# Patient Record
Sex: Male | Born: 1945
Health system: Southern US, Community
[De-identification: ages and names within clinical notes are randomized; demographics above are authoritative.]

## PROBLEM LIST (undated history)

## (undated) DIAGNOSIS — E7800 Pure hypercholesterolemia, unspecified: Secondary | ICD-10-CM

## (undated) DIAGNOSIS — F329 Major depressive disorder, single episode, unspecified: Secondary | ICD-10-CM

## (undated) DIAGNOSIS — F32A Depression, unspecified: Secondary | ICD-10-CM

## (undated) DIAGNOSIS — F0390 Unspecified dementia without behavioral disturbance: Secondary | ICD-10-CM

## (undated) DIAGNOSIS — K219 Gastro-esophageal reflux disease without esophagitis: Secondary | ICD-10-CM

## (undated) DIAGNOSIS — I10 Essential (primary) hypertension: Secondary | ICD-10-CM

## (undated) DIAGNOSIS — M1990 Unspecified osteoarthritis, unspecified site: Secondary | ICD-10-CM

## (undated) DIAGNOSIS — E039 Hypothyroidism, unspecified: Secondary | ICD-10-CM

## (undated) HISTORY — PX: ESOPHAGOGASTRODUODENOSCOPY: SHX1529

## (undated) HISTORY — DX: Unspecified osteoarthritis, unspecified site: M19.90

## (undated) HISTORY — DX: Depression, unspecified: F32.A

## (undated) HISTORY — PX: EXCISIONAL HEMORRHOIDECTOMY: SHX1541

## (undated) HISTORY — DX: Gastro-esophageal reflux disease without esophagitis: K21.9

## (undated) HISTORY — DX: Hypothyroidism, unspecified: E03.9

## (undated) HISTORY — PX: TONSILLECTOMY: SUR1361

---
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
## 2002-05-19 ENCOUNTER — Ambulatory Visit (HOSPITAL_COMMUNITY): Admission: RE | Admit: 2002-05-19 | Discharge: 2002-05-19 | Payer: Self-pay | Admitting: Internal Medicine

## 2004-03-16 HISTORY — PX: COLONOSCOPY: SHX174

## 2004-11-28 ENCOUNTER — Ambulatory Visit: Payer: Self-pay | Admitting: Internal Medicine

## 2004-11-28 ENCOUNTER — Ambulatory Visit (HOSPITAL_COMMUNITY): Admission: RE | Admit: 2004-11-28 | Discharge: 2004-11-28 | Payer: Self-pay | Admitting: Internal Medicine

## 2006-02-24 ENCOUNTER — Ambulatory Visit (HOSPITAL_COMMUNITY): Admission: RE | Admit: 2006-02-24 | Discharge: 2006-02-24 | Payer: Self-pay | Admitting: Internal Medicine

## 2011-05-08 DIAGNOSIS — E039 Hypothyroidism, unspecified: Secondary | ICD-10-CM | POA: Diagnosis not present

## 2011-05-15 DIAGNOSIS — I10 Essential (primary) hypertension: Secondary | ICD-10-CM | POA: Diagnosis not present

## 2011-05-15 DIAGNOSIS — E039 Hypothyroidism, unspecified: Secondary | ICD-10-CM | POA: Diagnosis not present

## 2011-09-24 DIAGNOSIS — I10 Essential (primary) hypertension: Secondary | ICD-10-CM | POA: Diagnosis not present

## 2011-11-02 DIAGNOSIS — H25019 Cortical age-related cataract, unspecified eye: Secondary | ICD-10-CM | POA: Diagnosis not present

## 2011-11-02 DIAGNOSIS — H526 Other disorders of refraction: Secondary | ICD-10-CM | POA: Diagnosis not present

## 2012-01-29 DIAGNOSIS — E785 Hyperlipidemia, unspecified: Secondary | ICD-10-CM | POA: Diagnosis not present

## 2012-01-29 DIAGNOSIS — Z79899 Other long term (current) drug therapy: Secondary | ICD-10-CM | POA: Diagnosis not present

## 2012-01-29 DIAGNOSIS — Z125 Encounter for screening for malignant neoplasm of prostate: Secondary | ICD-10-CM | POA: Diagnosis not present

## 2012-01-29 DIAGNOSIS — E039 Hypothyroidism, unspecified: Secondary | ICD-10-CM | POA: Diagnosis not present

## 2012-01-29 DIAGNOSIS — R7301 Impaired fasting glucose: Secondary | ICD-10-CM | POA: Diagnosis not present

## 2012-01-29 DIAGNOSIS — I10 Essential (primary) hypertension: Secondary | ICD-10-CM | POA: Diagnosis not present

## 2012-02-05 DIAGNOSIS — E039 Hypothyroidism, unspecified: Secondary | ICD-10-CM | POA: Diagnosis not present

## 2012-02-05 DIAGNOSIS — Z23 Encounter for immunization: Secondary | ICD-10-CM | POA: Diagnosis not present

## 2012-02-05 DIAGNOSIS — R7301 Impaired fasting glucose: Secondary | ICD-10-CM | POA: Diagnosis not present

## 2012-02-05 DIAGNOSIS — E785 Hyperlipidemia, unspecified: Secondary | ICD-10-CM | POA: Diagnosis not present

## 2012-02-05 DIAGNOSIS — Z1212 Encounter for screening for malignant neoplasm of rectum: Secondary | ICD-10-CM | POA: Diagnosis not present

## 2012-02-05 DIAGNOSIS — I10 Essential (primary) hypertension: Secondary | ICD-10-CM | POA: Diagnosis not present

## 2012-05-05 DIAGNOSIS — E785 Hyperlipidemia, unspecified: Secondary | ICD-10-CM | POA: Diagnosis not present

## 2012-05-05 DIAGNOSIS — E039 Hypothyroidism, unspecified: Secondary | ICD-10-CM | POA: Diagnosis not present

## 2012-05-12 DIAGNOSIS — E785 Hyperlipidemia, unspecified: Secondary | ICD-10-CM | POA: Diagnosis not present

## 2012-05-12 DIAGNOSIS — E039 Hypothyroidism, unspecified: Secondary | ICD-10-CM | POA: Diagnosis not present

## 2012-06-08 DIAGNOSIS — H25099 Other age-related incipient cataract, unspecified eye: Secondary | ICD-10-CM | POA: Diagnosis not present

## 2012-08-01 DIAGNOSIS — M79609 Pain in unspecified limb: Secondary | ICD-10-CM | POA: Diagnosis not present

## 2012-08-01 DIAGNOSIS — R21 Rash and other nonspecific skin eruption: Secondary | ICD-10-CM | POA: Diagnosis not present

## 2012-08-04 DIAGNOSIS — E039 Hypothyroidism, unspecified: Secondary | ICD-10-CM | POA: Diagnosis not present

## 2012-08-10 DIAGNOSIS — E039 Hypothyroidism, unspecified: Secondary | ICD-10-CM | POA: Diagnosis not present

## 2012-08-19 DIAGNOSIS — E039 Hypothyroidism, unspecified: Secondary | ICD-10-CM | POA: Diagnosis not present

## 2012-08-19 DIAGNOSIS — E785 Hyperlipidemia, unspecified: Secondary | ICD-10-CM | POA: Diagnosis not present

## 2013-02-03 DIAGNOSIS — E039 Hypothyroidism, unspecified: Secondary | ICD-10-CM | POA: Diagnosis not present

## 2013-02-03 DIAGNOSIS — R7301 Impaired fasting glucose: Secondary | ICD-10-CM | POA: Diagnosis not present

## 2013-02-03 DIAGNOSIS — I10 Essential (primary) hypertension: Secondary | ICD-10-CM | POA: Diagnosis not present

## 2013-02-03 DIAGNOSIS — K219 Gastro-esophageal reflux disease without esophagitis: Secondary | ICD-10-CM | POA: Diagnosis not present

## 2013-02-03 DIAGNOSIS — Z79899 Other long term (current) drug therapy: Secondary | ICD-10-CM | POA: Diagnosis not present

## 2013-02-06 DIAGNOSIS — Z23 Encounter for immunization: Secondary | ICD-10-CM | POA: Diagnosis not present

## 2013-02-17 DIAGNOSIS — Z0000 Encounter for general adult medical examination without abnormal findings: Secondary | ICD-10-CM | POA: Diagnosis not present

## 2013-02-17 DIAGNOSIS — Z1212 Encounter for screening for malignant neoplasm of rectum: Secondary | ICD-10-CM | POA: Diagnosis not present

## 2013-06-02 DIAGNOSIS — E039 Hypothyroidism, unspecified: Secondary | ICD-10-CM | POA: Diagnosis not present

## 2013-06-09 DIAGNOSIS — E039 Hypothyroidism, unspecified: Secondary | ICD-10-CM | POA: Diagnosis not present

## 2013-06-09 DIAGNOSIS — I10 Essential (primary) hypertension: Secondary | ICD-10-CM | POA: Diagnosis not present

## 2013-10-19 DIAGNOSIS — I10 Essential (primary) hypertension: Secondary | ICD-10-CM | POA: Diagnosis not present

## 2013-12-07 DIAGNOSIS — I10 Essential (primary) hypertension: Secondary | ICD-10-CM | POA: Diagnosis not present

## 2014-01-18 DIAGNOSIS — R208 Other disturbances of skin sensation: Secondary | ICD-10-CM | POA: Diagnosis not present

## 2014-01-18 DIAGNOSIS — D2272 Melanocytic nevi of left lower limb, including hip: Secondary | ICD-10-CM | POA: Diagnosis not present

## 2014-01-18 DIAGNOSIS — L821 Other seborrheic keratosis: Secondary | ICD-10-CM | POA: Diagnosis not present

## 2014-01-18 DIAGNOSIS — D1801 Hemangioma of skin and subcutaneous tissue: Secondary | ICD-10-CM | POA: Diagnosis not present

## 2014-01-18 DIAGNOSIS — L570 Actinic keratosis: Secondary | ICD-10-CM | POA: Diagnosis not present

## 2014-02-15 DIAGNOSIS — M1990 Unspecified osteoarthritis, unspecified site: Secondary | ICD-10-CM | POA: Diagnosis not present

## 2014-02-15 DIAGNOSIS — K219 Gastro-esophageal reflux disease without esophagitis: Secondary | ICD-10-CM | POA: Diagnosis not present

## 2014-02-15 DIAGNOSIS — E785 Hyperlipidemia, unspecified: Secondary | ICD-10-CM | POA: Diagnosis not present

## 2014-02-15 DIAGNOSIS — E039 Hypothyroidism, unspecified: Secondary | ICD-10-CM | POA: Diagnosis not present

## 2014-02-15 DIAGNOSIS — Z125 Encounter for screening for malignant neoplasm of prostate: Secondary | ICD-10-CM | POA: Diagnosis not present

## 2014-02-15 DIAGNOSIS — Z79899 Other long term (current) drug therapy: Secondary | ICD-10-CM | POA: Diagnosis not present

## 2014-02-23 DIAGNOSIS — Z0001 Encounter for general adult medical examination with abnormal findings: Secondary | ICD-10-CM | POA: Diagnosis not present

## 2014-06-20 DIAGNOSIS — E039 Hypothyroidism, unspecified: Secondary | ICD-10-CM | POA: Diagnosis not present

## 2014-06-22 DIAGNOSIS — E030 Congenital hypothyroidism with diffuse goiter: Secondary | ICD-10-CM | POA: Diagnosis not present

## 2014-06-22 DIAGNOSIS — I10 Essential (primary) hypertension: Secondary | ICD-10-CM | POA: Diagnosis not present

## 2014-06-22 DIAGNOSIS — Z23 Encounter for immunization: Secondary | ICD-10-CM | POA: Diagnosis not present

## 2014-08-10 DIAGNOSIS — R413 Other amnesia: Secondary | ICD-10-CM | POA: Diagnosis not present

## 2014-08-10 DIAGNOSIS — M25511 Pain in right shoulder: Secondary | ICD-10-CM | POA: Diagnosis not present

## 2014-08-10 DIAGNOSIS — M7712 Lateral epicondylitis, left elbow: Secondary | ICD-10-CM | POA: Diagnosis not present

## 2014-08-14 ENCOUNTER — Other Ambulatory Visit (HOSPITAL_COMMUNITY): Payer: Self-pay | Admitting: Internal Medicine

## 2014-08-14 DIAGNOSIS — R413 Other amnesia: Secondary | ICD-10-CM

## 2014-08-24 ENCOUNTER — Ambulatory Visit (HOSPITAL_COMMUNITY)
Admission: RE | Admit: 2014-08-24 | Discharge: 2014-08-24 | Disposition: A | Discharge status: Home / Self Care | Payer: Medicare Other | Source: Ambulatory Visit | Attending: Internal Medicine | Admitting: Internal Medicine

## 2014-08-24 DIAGNOSIS — Z818 Family history of other mental and behavioral disorders: Secondary | ICD-10-CM | POA: Diagnosis not present

## 2014-08-24 DIAGNOSIS — R413 Other amnesia: Secondary | ICD-10-CM | POA: Insufficient documentation

## 2014-08-29 DIAGNOSIS — M7712 Lateral epicondylitis, left elbow: Secondary | ICD-10-CM | POA: Diagnosis not present

## 2014-08-29 DIAGNOSIS — M7541 Impingement syndrome of right shoulder: Secondary | ICD-10-CM | POA: Diagnosis not present

## 2014-09-13 DIAGNOSIS — H524 Presbyopia: Secondary | ICD-10-CM | POA: Diagnosis not present

## 2014-09-13 DIAGNOSIS — H52223 Regular astigmatism, bilateral: Secondary | ICD-10-CM | POA: Diagnosis not present

## 2014-09-13 DIAGNOSIS — H18413 Arcus senilis, bilateral: Secondary | ICD-10-CM | POA: Diagnosis not present

## 2014-09-13 DIAGNOSIS — H5203 Hypermetropia, bilateral: Secondary | ICD-10-CM | POA: Diagnosis not present

## 2014-09-13 DIAGNOSIS — H11153 Pinguecula, bilateral: Secondary | ICD-10-CM | POA: Diagnosis not present

## 2014-09-13 DIAGNOSIS — H40053 Ocular hypertension, bilateral: Secondary | ICD-10-CM | POA: Diagnosis not present

## 2014-09-13 DIAGNOSIS — H40013 Open angle with borderline findings, low risk, bilateral: Secondary | ICD-10-CM | POA: Diagnosis not present

## 2014-09-13 DIAGNOSIS — H25013 Cortical age-related cataract, bilateral: Secondary | ICD-10-CM | POA: Diagnosis not present

## 2014-10-16 ENCOUNTER — Telehealth: Payer: Self-pay

## 2014-10-16 NOTE — Telephone Encounter (Signed)
Letter mailed to pt.  

## 2014-10-16 NOTE — Telephone Encounter (Signed)
PATIENT ON September RECALL FOR TCS

## 2014-11-08 DIAGNOSIS — E039 Hypothyroidism, unspecified: Secondary | ICD-10-CM | POA: Diagnosis not present

## 2014-11-16 DIAGNOSIS — E030 Congenital hypothyroidism with diffuse goiter: Secondary | ICD-10-CM | POA: Diagnosis not present

## 2014-11-16 DIAGNOSIS — Z6830 Body mass index (BMI) 30.0-30.9, adult: Secondary | ICD-10-CM | POA: Diagnosis not present

## 2014-11-16 DIAGNOSIS — R413 Other amnesia: Secondary | ICD-10-CM | POA: Diagnosis not present

## 2014-11-19 ENCOUNTER — Encounter (HOSPITAL_COMMUNITY): Payer: Self-pay | Admitting: *Deleted

## 2014-11-19 ENCOUNTER — Emergency Department (HOSPITAL_COMMUNITY)
Admission: EM | Admit: 2014-11-19 | Discharge: 2014-11-19 | Disposition: A | Discharge status: Home / Self Care | Payer: Medicare Other | Attending: Emergency Medicine | Admitting: Emergency Medicine

## 2014-11-19 DIAGNOSIS — Z79899 Other long term (current) drug therapy: Secondary | ICD-10-CM | POA: Insufficient documentation

## 2014-11-19 DIAGNOSIS — Z87891 Personal history of nicotine dependence: Secondary | ICD-10-CM | POA: Diagnosis not present

## 2014-11-19 DIAGNOSIS — R42 Dizziness and giddiness: Secondary | ICD-10-CM | POA: Diagnosis not present

## 2014-11-19 DIAGNOSIS — E780 Pure hypercholesterolemia: Secondary | ICD-10-CM | POA: Diagnosis not present

## 2014-11-19 DIAGNOSIS — R197 Diarrhea, unspecified: Secondary | ICD-10-CM | POA: Diagnosis not present

## 2014-11-19 DIAGNOSIS — R1110 Vomiting, unspecified: Secondary | ICD-10-CM

## 2014-11-19 DIAGNOSIS — K2970 Gastritis, unspecified, without bleeding: Secondary | ICD-10-CM | POA: Diagnosis not present

## 2014-11-19 DIAGNOSIS — R112 Nausea with vomiting, unspecified: Secondary | ICD-10-CM | POA: Diagnosis not present

## 2014-11-19 DIAGNOSIS — I10 Essential (primary) hypertension: Secondary | ICD-10-CM | POA: Diagnosis not present

## 2014-11-19 DIAGNOSIS — F0390 Unspecified dementia without behavioral disturbance: Secondary | ICD-10-CM | POA: Diagnosis not present

## 2014-11-19 HISTORY — DX: Essential (primary) hypertension: I10

## 2014-11-19 HISTORY — DX: Unspecified dementia, unspecified severity, without behavioral disturbance, psychotic disturbance, mood disturbance, and anxiety: F03.90

## 2014-11-19 HISTORY — DX: Pure hypercholesterolemia, unspecified: E78.00

## 2014-11-19 LAB — CBC WITH DIFFERENTIAL/PLATELET
BASOS ABS: 0 10*3/uL (ref 0.0–0.1)
BASOS PCT: 0 % (ref 0–1)
EOS ABS: 0 10*3/uL (ref 0.0–0.7)
EOS PCT: 0 % (ref 0–5)
HCT: 41.3 % (ref 39.0–52.0)
HEMOGLOBIN: 14.2 g/dL (ref 13.0–17.0)
Lymphocytes Relative: 9 % — ABNORMAL LOW (ref 12–46)
Lymphs Abs: 1.1 10*3/uL (ref 0.7–4.0)
MCH: 30.8 pg (ref 26.0–34.0)
MCHC: 34.4 g/dL (ref 30.0–36.0)
MCV: 89.6 fL (ref 78.0–100.0)
Monocytes Absolute: 0.5 10*3/uL (ref 0.1–1.0)
Monocytes Relative: 5 % (ref 3–12)
NEUTROS PCT: 86 % — AB (ref 43–77)
Neutro Abs: 10.1 10*3/uL — ABNORMAL HIGH (ref 1.7–7.7)
PLATELETS: 214 10*3/uL (ref 150–400)
RBC: 4.61 MIL/uL (ref 4.22–5.81)
RDW: 13.1 % (ref 11.5–15.5)
WBC: 11.8 10*3/uL — AB (ref 4.0–10.5)

## 2014-11-19 LAB — COMPREHENSIVE METABOLIC PANEL
ALT: 16 U/L — AB (ref 17–63)
AST: 21 U/L (ref 15–41)
Albumin: 4 g/dL (ref 3.5–5.0)
Alkaline Phosphatase: 81 U/L (ref 38–126)
Anion gap: 8 (ref 5–15)
BILIRUBIN TOTAL: 1 mg/dL (ref 0.3–1.2)
BUN: 14 mg/dL (ref 6–20)
CALCIUM: 8.8 mg/dL — AB (ref 8.9–10.3)
CO2: 27 mmol/L (ref 22–32)
CREATININE: 0.81 mg/dL (ref 0.61–1.24)
Chloride: 107 mmol/L (ref 101–111)
Glucose, Bld: 168 mg/dL — ABNORMAL HIGH (ref 65–99)
Potassium: 3.2 mmol/L — ABNORMAL LOW (ref 3.5–5.1)
Sodium: 142 mmol/L (ref 135–145)
TOTAL PROTEIN: 6.4 g/dL — AB (ref 6.5–8.1)

## 2014-11-19 LAB — URINALYSIS, ROUTINE W REFLEX MICROSCOPIC
BILIRUBIN URINE: NEGATIVE
Glucose, UA: NEGATIVE mg/dL
HGB URINE DIPSTICK: NEGATIVE
KETONES UR: 15 mg/dL — AB
Leukocytes, UA: NEGATIVE
NITRITE: NEGATIVE
Protein, ur: NEGATIVE mg/dL
SPECIFIC GRAVITY, URINE: 1.02 (ref 1.005–1.030)
UROBILINOGEN UA: 0.2 mg/dL (ref 0.0–1.0)
pH: 7.5 (ref 5.0–8.0)

## 2014-11-19 LAB — POC OCCULT BLOOD, ED: FECAL OCCULT BLD: NEGATIVE

## 2014-11-19 LAB — TROPONIN I

## 2014-11-19 MED ORDER — ONDANSETRON HCL 4 MG/2ML IJ SOLN
4.0000 mg | Freq: Once | INTRAMUSCULAR | Status: AC
  Administered 2014-11-19: 4 mg via INTRAVENOUS
  Filled 2014-11-19: qty 2

## 2014-11-19 MED ORDER — POTASSIUM CHLORIDE CRYS ER 20 MEQ PO TBCR
40.0000 meq | EXTENDED_RELEASE_TABLET | Freq: Once | ORAL | Status: AC
  Administered 2014-11-19: 40 meq via ORAL
  Filled 2014-11-19: qty 2

## 2014-11-19 MED ORDER — SODIUM CHLORIDE 0.9 % IV BOLUS (SEPSIS)
1000.0000 mL | Freq: Once | INTRAVENOUS | Status: AC
  Administered 2014-11-19: 1000 mL via INTRAVENOUS

## 2014-11-19 MED ORDER — ONDANSETRON HCL 4 MG PO TABS: 4.0000 mg | ORAL_TABLET | Freq: Three times a day (TID) | ORAL | Status: DC | PRN

## 2014-11-19 NOTE — Discharge Instructions (Signed)
Drink plenty of fluids this morning, if doing well at lunchtime, you can have a bland diet such as toast, Jell-O, biscuits, mashed potatoes, Campbell soup. Slowly get your diet back to normal over the next 24 hours. Use the Zofran if you get nausea or vomiting again. Avoid milk until the diarrhea is gone. Return to the ED if you get abdominal pain, get lightheaded and feel like you are going to pass out, or you have uncontrollable diarrhea.   Diarrhea Diarrhea is watery poop (stool). It can make you feel weak, tired, thirsty, or give you a dry mouth (signs of dehydration). Watery poop is a sign of another problem, most often an infection. It often lasts 2-3 days. It can last longer if it is a sign of something serious. Take care of yourself as told by your doctor. HOME CARE   Drink 1 cup (8 ounces) of fluid each time you have watery poop.  Do not drink the following fluids:  Those that contain simple sugars (fructose, glucose, galactose, lactose, sucrose, maltose).  Sports drinks.  Fruit juices.  Whole milk products.  Sodas.  Drinks with caffeine (coffee, tea, soda) or alcohol.  Oral rehydration solution may be used if the doctor says it is okay. You may make your own solution. Follow this recipe:   - teaspoon table salt.   teaspoon baking soda.   teaspoon salt substitute containing potassium chloride.  1 tablespoons sugar.  1 liter (34 ounces) of water.  Avoid the following foods:  High fiber foods, such as raw fruits and vegetables.  Nuts, seeds, and whole grain breads and cereals.   Those that are sweetened with sugar alcohols (xylitol, sorbitol, mannitol).  Try eating the following foods:  Starchy foods, such as rice, toast, pasta, low-sugar cereal, oatmeal, baked potatoes, crackers, and bagels.  Bananas.  Applesauce.  Eat probiotic-rich foods, such as yogurt and milk products that are fermented.  Wash your hands well after each time you have watery  poop.  Only take medicine as told by your doctor.  Take a warm bath to help lessen burning or pain from having watery poop. GET HELP RIGHT AWAY IF:   You cannot drink fluids without throwing up (vomiting).  You keep throwing up.  You have blood in your poop, or your poop looks black and tarry.  You do not pee (urinate) in 6-8 hours, or there is only a small amount of very dark pee.  You have belly (abdominal) pain that gets worse or stays in the same spot (localizes).  You are weak, dizzy, confused, or light-headed.  You have a very bad headache.  Your watery poop gets worse or does not get better.  You have a fever or lasting symptoms for more than 2-3 days.  You have a fever and your symptoms suddenly get worse. MAKE SURE YOU:   Understand these instructions.  Will watch your condition.  Will get help right away if you are not doing well or get worse. Document Released: 08/19/2007 Document Revised: 07/17/2013 Document Reviewed: 11/08/2011 [facility] Patient Information 2015 [place], [place]. This information is not intended to replace advice given to you by your health care provider. Make sure you discuss any questions you have with your health care provider.  Nausea and Vomiting Nausea means you feel sick to your stomach. Throwing up (vomiting) is a reflex where stomach contents come out of your mouth. HOME CARE   Take medicine as told by your doctor.  Do not force yourself to  eat. However, you do need to drink fluids.  If you feel like eating, eat a normal diet as told by your doctor.  Eat rice, wheat, potatoes, bread, lean meats, yogurt, fruits, and vegetables.  Avoid high-fat foods.  Drink enough fluids to keep your pee (urine) clear or pale yellow.  Ask your doctor how to replace body fluid losses (rehydrate). Signs of body fluid loss (dehydration) include:  Feeling very thirsty.  Dry lips and mouth.  Feeling dizzy.  Dark pee.  Peeing less than  normal.  Feeling confused.  Fast breathing or heart rate. GET HELP RIGHT AWAY IF:   You have blood in your throw up.  You have black or bloody poop (stool).  You have a bad headache or stiff neck.  You feel confused.  You have bad belly (abdominal) pain.  You have chest pain or trouble breathing.  You do not pee at least once every 8 hours.  You have cold, clammy skin.  You keep throwing up after 24 to 48 hours.  You have a fever. MAKE SURE YOU:   Understand these instructions.  Will watch your condition.  Will get help right away if you are not doing well or get worse. Document Released: 08/19/2007 Document Revised: 05/25/2011 Document Reviewed: 08/01/2010 [facility] Patient Information 2015 [place], [place]. This information is not intended to replace advice given to you by your health care provider. Make sure you discuss any questions you have with your health care provider.

## 2014-11-19 NOTE — ED Notes (Signed)
Pt alert & oriented x4, stable gait. Patient  given discharge instructions, paperwork & prescription(s). Patient  instructed to stop at the registration desk to finish any additional paperwork. Patient  verbalized understanding. Pt left department in wheelchair escorted by staff. Pt had no further questions.

## 2014-11-19 NOTE — ED Notes (Signed)
Pt very vague with answers, ems reports they were called out for rectal bleed; pt states he had an episode of diarrhea tonight and profuse sweating; pt denies any pain; pt states his diarrhea was black

## 2014-11-19 NOTE — ED Provider Notes (Signed)
CSN: [id number]     Arrival date & time 11/19/14  0228 History   First MD Initiated Contact with Patient 11/19/14 [phone]     Chief Complaint  Patient presents with  . Diarrhea     (Consider location/radiation/quality/duration/timing/severity/associated sxs/prior Treatment) HPI patient reports he felt fine all day today. About 9:30 PM he started getting very nauseated. When he went to the bathroom he got very dizzy however he did not pass out. He reports one episode of black diarrhea. He had a small episode of vomiting. He relates however he was drenched in sweat. He denies any chest pain or abdominal pain. He denies taking Pepto-Bismol. He states he still has nausea. He denies any actual syncope. He states he did not eat anything different and he had no change in his activity. Wife states she can hear his abdomen making audible noise.  PCP Dr Willey Blade  Past Medical History  Diagnosis Date  . Dementia   . Hypertension   . Hypercholesteremia    History reviewed. No pertinent past surgical history. History reviewed. No pertinent family history. Social History  Substance Use Topics  . Smoking status: Former Research scientist (life sciences)  . Smokeless tobacco: None  . Alcohol Use: No  lives at home Lives with spouse  Review of Systems  All other systems reviewed and are negative.     Allergies  Review of patient's allergies indicates no known allergies.  Home Medications   Prior to Admission medications   Medication Sig Start Date End Date Taking? Authorizing Provider  amLODipine (NORVASC) 10 MG tablet Take 10 mg by mouth daily.   Yes Historical Provider, MD  atorvastatin (LIPITOR) 80 MG tablet Take 80 mg by mouth at bedtime.   Yes Historical Provider, MD  donepezil (ARICEPT) 10 MG tablet Take 10 mg by mouth at bedtime.   Yes Historical Provider, MD  levothyroxine (SYNTHROID, LEVOTHROID) 150 MCG tablet Take 150 mcg by mouth daily before breakfast.   Yes Historical Provider, MD  lisinopril  (PRINIVIL,ZESTRIL) 40 MG tablet Take 40 mg by mouth daily.   Yes Historical Provider, MD  meclizine (ANTIVERT) 25 MG tablet Take 25 mg by mouth 3 (three) times daily.   Yes Historical Provider, MD  ondansetron (ZOFRAN) 4 MG tablet Take 1 tablet (4 mg total) by mouth every 8 (eight) hours as needed. 11/19/14   Rolland Porter, MD   BP 146/57 mmHg  Pulse 58  Temp(Src) 97.4 F (36.3 C) (Oral)  Resp 18  Ht 6\' 3"  (1.905 m)  Wt 229 lb (103.874 kg)  BMI 28.62 kg/m2  SpO2 100%  Vital signs normal except for bradycardia  Physical Exam  Constitutional: He is oriented to person, place, and time. He appears well-developed and well-nourished.  Non-toxic appearance. He does not appear ill. No distress.  HENT:  Head: Normocephalic and atraumatic.  Right Ear: External ear normal.  Left Ear: External ear normal.  Nose: Nose normal. No mucosal edema or rhinorrhea.  Mouth/Throat: Oropharynx is clear and moist and mucous membranes are normal. No dental abscesses or uvula swelling.  Eyes: Conjunctivae and EOM are normal. Pupils are equal, round, and reactive to light.  Neck: Normal range of motion and full passive range of motion without pain. Neck supple.  Cardiovascular: Normal rate, regular rhythm and normal heart sounds.  Exam reveals no gallop and no friction rub.   No murmur heard. Pulmonary/Chest: Effort normal and breath sounds normal. No respiratory distress. He has no wheezes. He has no rhonchi. He has no rales.  He exhibits no tenderness and no crepitus.  Abdominal: Soft. Normal appearance and bowel sounds are normal. He exhibits no distension. There is no tenderness. There is no rebound and no guarding.  Genitourinary: Guaiac negative stool.  Musculoskeletal: Normal range of motion. He exhibits no edema or tenderness.  Moves all extremities well.   Neurological: He is alert and oriented to person, place, and time. He has normal strength. No cranial nerve deficit.  Skin: Skin is warm, dry and intact.  No rash noted. No erythema. No pallor.  Psychiatric: He has a normal mood and affect. His speech is normal and behavior is normal. His mood appears not anxious.  Nursing note and vitals reviewed.   ED Course  Procedures (including critical care time)  Medications  sodium chloride 0.9 % bolus 1,000 mL (0 mLs Intravenous Stopped 11/19/14 0453)  ondansetron (ZOFRAN) injection 4 mg (4 mg Intravenous Given 11/19/14 0307)  potassium chloride SA (K-DUR,KLOR-CON) CR tablet 40 mEq (40 mEq Oral Given 11/19/14 0450)    Patient was given IV fluids and IV Zofran for his nausea.  After reviewing patient's labs he was given oral potassium.  Patient and his wife are given his laboratory results. He states he is feeling better and he feels like he can try drinking oral fluids to see if he is going to be able to tolerate it go home.  Patient was given oral fluids which he tolerated well. Patient was discharged.   Labs Review Results for orders placed or performed during the hospital encounter of 11/19/14  Comprehensive metabolic panel  Result Value Ref Range   Sodium 142 135 - 145 mmol/L   Potassium 3.2 (L) 3.5 - 5.1 mmol/L   Chloride 107 101 - 111 mmol/L   CO2 27 22 - 32 mmol/L   Glucose, Bld 168 (H) 65 - 99 mg/dL   BUN 14 6 - 20 mg/dL   Creatinine, Ser 0.81 0.61 - 1.24 mg/dL   Calcium 8.8 (L) 8.9 - 10.3 mg/dL   Total Protein 6.4 (L) 6.5 - 8.1 g/dL   Albumin 4.0 3.5 - 5.0 g/dL   AST 21 15 - 41 U/L   ALT 16 (L) 17 - 63 U/L   Alkaline Phosphatase 81 38 - 126 U/L   Total Bilirubin 1.0 0.3 - 1.2 mg/dL   GFR calc non Af Amer >60 >60 mL/min   GFR calc Af Amer >60 >60 mL/min   Anion gap 8 5 - 15  CBC with Differential  Result Value Ref Range   WBC 11.8 (H) 4.0 - 10.5 K/uL   RBC 4.61 4.22 - 5.81 MIL/uL   Hemoglobin 14.2 13.0 - 17.0 g/dL   HCT 41.3 39.0 - 52.0 %   MCV 89.6 78.0 - 100.0 fL   MCH 30.8 26.0 - 34.0 pg   MCHC 34.4 30.0 - 36.0 g/dL   RDW 13.1 11.5 - 15.5 %   Platelets 214 150 - 400  K/uL   Neutrophils Relative % 86 (H) 43 - 77 %   Neutro Abs 10.1 (H) 1.7 - 7.7 K/uL   Lymphocytes Relative 9 (L) 12 - 46 %   Lymphs Abs 1.1 0.7 - 4.0 K/uL   Monocytes Relative 5 3 - 12 %   Monocytes Absolute 0.5 0.1 - 1.0 K/uL   Eosinophils Relative 0 0 - 5 %   Eosinophils Absolute 0.0 0.0 - 0.7 K/uL   Basophils Relative 0 0 - 1 %   Basophils Absolute 0.0 0.0 - 0.1 K/uL  Troponin I  Result Value Ref Range   Troponin I <0.03 <0.031 ng/mL  Urinalysis, Routine w reflex microscopic  Result Value Ref Range   Color, Urine YELLOW YELLOW   APPearance CLEAR CLEAR   Specific Gravity, Urine 1.020 1.005 - 1.030   pH 7.5 5.0 - 8.0   Glucose, UA NEGATIVE NEGATIVE mg/dL   Hgb urine dipstick NEGATIVE NEGATIVE   Bilirubin Urine NEGATIVE NEGATIVE   Ketones, ur 15 (A) NEGATIVE mg/dL   Protein, ur NEGATIVE NEGATIVE mg/dL   Urobilinogen, UA 0.2 0.0 - 1.0 mg/dL   Nitrite NEGATIVE NEGATIVE   Leukocytes, UA NEGATIVE NEGATIVE  POC occult blood, ED RN will collect  Result Value Ref Range   Fecal Occult Bld NEGATIVE NEGATIVE   Laboratory interpretation all normal except hypokalemia and leukocytosis     Imaging Review No results found. I have personally reviewed and evaluated these images and lab results as part of my medical decision-making.   EKG Interpretation   Date/Time:  Monday November 19 2014 03:01:59 EDT Ventricular Rate:  63 PR Interval:  233 QRS Duration: 101 QT Interval:  459 QTC Calculation: 470 R Axis:   88 Text Interpretation:  Sinus rhythm Prolonged PR interval Borderline right  axis deviation Borderline T wave abnormalities No old tracing to compare  Confirmed by Layonna Dobie  MD-I, Geovonni Meyerhoff ([id number]) on 11/19/2014 3:17:56 AM      MDM   Final diagnoses:  Vomiting and diarrhea   New Prescriptions   ONDANSETRON (ZOFRAN) 4 MG TABLET    Take 1 tablet (4 mg total) by mouth every 8 (eight) hours as needed.    Plan discharge  Rolland Porter, MD, Barbette Or, MD 11/19/14 [phone]

## 2014-11-19 NOTE — ED Notes (Signed)
Drink provided to pt as per EDP.

## 2014-11-19 NOTE — ED Notes (Signed)
Pt states no nausea after drinking coke.

## 2014-11-21 DIAGNOSIS — A09 Infectious gastroenteritis and colitis, unspecified: Secondary | ICD-10-CM | POA: Diagnosis not present

## 2014-11-21 DIAGNOSIS — Z6829 Body mass index (BMI) 29.0-29.9, adult: Secondary | ICD-10-CM | POA: Diagnosis not present

## 2015-01-24 DIAGNOSIS — Z23 Encounter for immunization: Secondary | ICD-10-CM | POA: Diagnosis not present

## 2015-02-22 DIAGNOSIS — E785 Hyperlipidemia, unspecified: Secondary | ICD-10-CM | POA: Diagnosis not present

## 2015-02-22 DIAGNOSIS — E039 Hypothyroidism, unspecified: Secondary | ICD-10-CM | POA: Diagnosis not present

## 2015-02-22 DIAGNOSIS — Z79899 Other long term (current) drug therapy: Secondary | ICD-10-CM | POA: Diagnosis not present

## 2015-02-22 DIAGNOSIS — K219 Gastro-esophageal reflux disease without esophagitis: Secondary | ICD-10-CM | POA: Diagnosis not present

## 2015-02-22 DIAGNOSIS — Z125 Encounter for screening for malignant neoplasm of prostate: Secondary | ICD-10-CM | POA: Diagnosis not present

## 2015-02-22 DIAGNOSIS — R7301 Impaired fasting glucose: Secondary | ICD-10-CM | POA: Diagnosis not present

## 2015-03-01 DIAGNOSIS — E030 Congenital hypothyroidism with diffuse goiter: Secondary | ICD-10-CM | POA: Diagnosis not present

## 2015-03-01 DIAGNOSIS — E785 Hyperlipidemia, unspecified: Secondary | ICD-10-CM | POA: Diagnosis not present

## 2015-03-01 DIAGNOSIS — Z6830 Body mass index (BMI) 30.0-30.9, adult: Secondary | ICD-10-CM | POA: Diagnosis not present

## 2015-03-01 DIAGNOSIS — I10 Essential (primary) hypertension: Secondary | ICD-10-CM | POA: Diagnosis not present

## 2015-04-26 DIAGNOSIS — Z6830 Body mass index (BMI) 30.0-30.9, adult: Secondary | ICD-10-CM | POA: Diagnosis not present

## 2015-04-26 DIAGNOSIS — I10 Essential (primary) hypertension: Secondary | ICD-10-CM | POA: Diagnosis not present

## 2015-04-26 DIAGNOSIS — R413 Other amnesia: Secondary | ICD-10-CM | POA: Diagnosis not present

## 2015-06-07 DIAGNOSIS — R413 Other amnesia: Secondary | ICD-10-CM | POA: Diagnosis not present

## 2015-06-07 DIAGNOSIS — I10 Essential (primary) hypertension: Secondary | ICD-10-CM | POA: Diagnosis not present

## 2015-07-04 DIAGNOSIS — M25562 Pain in left knee: Secondary | ICD-10-CM | POA: Diagnosis not present

## 2015-09-12 DIAGNOSIS — I10 Essential (primary) hypertension: Secondary | ICD-10-CM | POA: Diagnosis not present

## 2015-09-12 DIAGNOSIS — R413 Other amnesia: Secondary | ICD-10-CM | POA: Diagnosis not present

## 2015-12-13 DIAGNOSIS — I10 Essential (primary) hypertension: Secondary | ICD-10-CM | POA: Diagnosis not present

## 2015-12-13 DIAGNOSIS — Z23 Encounter for immunization: Secondary | ICD-10-CM | POA: Diagnosis not present

## 2015-12-13 DIAGNOSIS — R413 Other amnesia: Secondary | ICD-10-CM | POA: Diagnosis not present

## 2016-03-17 DIAGNOSIS — R7301 Impaired fasting glucose: Secondary | ICD-10-CM | POA: Diagnosis not present

## 2016-03-17 DIAGNOSIS — Z79899 Other long term (current) drug therapy: Secondary | ICD-10-CM | POA: Diagnosis not present

## 2016-03-17 DIAGNOSIS — K219 Gastro-esophageal reflux disease without esophagitis: Secondary | ICD-10-CM | POA: Diagnosis not present

## 2016-03-17 DIAGNOSIS — I10 Essential (primary) hypertension: Secondary | ICD-10-CM | POA: Diagnosis not present

## 2016-03-17 DIAGNOSIS — E039 Hypothyroidism, unspecified: Secondary | ICD-10-CM | POA: Diagnosis not present

## 2016-03-17 DIAGNOSIS — E785 Hyperlipidemia, unspecified: Secondary | ICD-10-CM | POA: Diagnosis not present

## 2016-03-24 ENCOUNTER — Telehealth: Payer: Self-pay

## 2016-03-24 DIAGNOSIS — E785 Hyperlipidemia, unspecified: Secondary | ICD-10-CM | POA: Diagnosis not present

## 2016-03-24 DIAGNOSIS — E039 Hypothyroidism, unspecified: Secondary | ICD-10-CM | POA: Diagnosis not present

## 2016-03-24 DIAGNOSIS — Z6832 Body mass index (BMI) 32.0-32.9, adult: Secondary | ICD-10-CM | POA: Diagnosis not present

## 2016-03-24 DIAGNOSIS — Z0001 Encounter for general adult medical examination with abnormal findings: Secondary | ICD-10-CM | POA: Diagnosis not present

## 2016-03-24 NOTE — Telephone Encounter (Signed)
PATIENT DUE FOR 10 YR TCS  PLEASE CALL [phone]

## 2016-03-24 NOTE — Telephone Encounter (Signed)
Called to triage pt. Spoke to pt's wife, [name]. She said he is also having some swallowing problems. OV with Roseanne Kaufman, NP on 03/26/2016 at 10:00 Am.

## 2016-03-26 ENCOUNTER — Telehealth: Payer: Self-pay

## 2016-03-26 ENCOUNTER — Other Ambulatory Visit: Payer: Self-pay

## 2016-03-26 ENCOUNTER — Ambulatory Visit (INDEPENDENT_AMBULATORY_CARE_PROVIDER_SITE_OTHER): Payer: Medicare HMO | Admitting: Gastroenterology

## 2016-03-26 ENCOUNTER — Encounter: Payer: Self-pay | Admitting: Gastroenterology

## 2016-03-26 VITALS — BP 157/76 | HR 57 | Temp 97.6°F | Ht 75.0 in | Wt 252.0 lb

## 2016-03-26 DIAGNOSIS — Z1211 Encounter for screening for malignant neoplasm of colon: Secondary | ICD-10-CM

## 2016-03-26 DIAGNOSIS — R1310 Dysphagia, unspecified: Secondary | ICD-10-CM | POA: Diagnosis not present

## 2016-03-26 DIAGNOSIS — R1319 Other dysphagia: Secondary | ICD-10-CM

## 2016-03-26 MED ORDER — PEG 3350-KCL-NA BICARB-NACL 420 G PO SOLR: 4000.0000 mL | ORAL | 0 refills | Status: DC

## 2016-03-26 NOTE — Progress Notes (Signed)
Primary Care Physician:  Asencion Noble, MD Primary Gastroenterologist:  Dr. Gala Romney   Chief Complaint  Patient presents with  . Colonoscopy  . EGD    HPI:   Javier Morgan is a 71 y.o. male presenting today to schedule routine 10-year-screening colonoscopy but was brought in due to reports of dysphagia when triaged.    Colonoscopy in 2006 per patient by Dr. Gala Romney. Reports remote history of dilation, over 10 years ago. Dysphagia present for "quite some time". Worse with steak. No liquid dysphagia. No abdominal pain. No rectal bleeding. No constipation or diarrhea. Has been on omeprazole historically. Occasionally reflux. Using tums prn. Happens only depending on what he eats and how much he eats. No unexplained weight loss or lack of appetite. Hesitant to take a PPI due to news reports.    Past Medical History:  Diagnosis Date  . Dementia   . Hypercholesteremia   . Hypertension     Past Surgical History:  Procedure Laterality Date  . COLONOSCOPY  2006   records not available at time of appt   . ESOPHAGOGASTRODUODENOSCOPY     remote past, with dilation     Current Outpatient Prescriptions  Medication Sig Dispense Refill  . amLODipine (NORVASC) 10 MG tablet Take 10 mg by mouth daily.    Marland Kitchen atorvastatin (LIPITOR) 80 MG tablet Take 80 mg by mouth at bedtime.    . donepezil (ARICEPT) 10 MG tablet Take 10 mg by mouth at bedtime.    Marland Kitchen escitalopram (LEXAPRO) 10 MG tablet Take 10 mg by mouth daily.    Marland Kitchen levothyroxine (SYNTHROID, LEVOTHROID) 150 MCG tablet Take 175 mcg by mouth daily before breakfast.     . lisinopril (PRINIVIL,ZESTRIL) 40 MG tablet Take 40 mg by mouth daily.    . meclizine (ANTIVERT) 25 MG tablet Take 25 mg by mouth 3 (three) times daily.    . vitamin B-12 (CYANOCOBALAMIN) 1000 MCG tablet Take 1,000 mcg by mouth daily.     No current facility-administered medications for this visit.     Allergies as of 03/26/2016  . (No Known Allergies)    Family History    Problem Relation Age of Onset  . Colon cancer Neg Hx     Social History   Social History  . Marital status: Married    Spouse name: N/A  . Number of children: N/A  . Years of education: N/A   Occupational History  . Not on file.   Social History Main Topics  . Smoking status: Former Research scientist (life sciences)  . Smokeless tobacco: Not on file     Comment: quit about 30-45 years ago   . Alcohol use No  . Drug use: No  . Sexual activity: Not on file   Other Topics Concern  . Not on file   Social History Narrative  . No narrative on file    Review of Systems: Gen: Denies any fever, chills, fatigue, weight loss, lack of appetite.  CV: Denies chest pain, heart palpitations, peripheral edema, syncope.  Resp: Denies shortness of breath at rest or with exertion. Denies wheezing or cough.  GI: see HPI  GU : Denies urinary burning, urinary frequency, urinary hesitancy MS: +joint pain  Derm: Denies rash, itching, dry skin Psych: Denies depression, anxiety, memory loss, and confusion Heme: see HPI   Physical Exam: BP (!) 157/76   Pulse (!) 57   Temp 97.6 F (36.4 C) (Oral)   Ht 6\' 3"  (1.905 m)   Wt 252  lb (114.3 kg)   BMI 31.50 kg/m  General:   Alert and oriented. Pleasant and cooperative. Well-nourished and well-developed.  Head:  Normocephalic and atraumatic. Eyes:  Without icterus, sclera clear and conjunctiva pink.  Ears:  Normal auditory acuity. Nose:  No deformity, discharge,  or lesions. Lungs:  Clear to auscultation bilaterally. No wheezes, rales, or rhonchi. No distress.  Heart:  S1, S2 present without murmurs appreciated.  Abdomen:  +BS, soft, non-tender and non-distended. No HSM noted. No guarding or rebound. No masses appreciated.  Rectal:  Deferred  Msk:  Symmetrical without gross deformities. Normal posture. Extremities:  Without  edema. Neurologic:  Alert and  oriented x4;  grossly normal neurologically. Psych:  Alert and cooperative. Normal mood and affect.

## 2016-03-26 NOTE — Assessment & Plan Note (Signed)
Chronic solid food dysphagia, rare GERD symptoms that are usually attributed to food choices. Reports remote EGD with dilation over 10 years ago. Records not available at time of procedure. Doubt malignancy. Patient concerned regarding chronic PPI use and stopped omeprazole some time ago. Will use Zantac prn and decide on best modality for treatment after EGD completed.   Proceed with upper endoscopy/dilation in the near future with Dr. Gala Romney. The risks, benefits, and alternatives have been discussed in detail with patient. They have stated understanding and desire to proceed.

## 2016-03-26 NOTE — Progress Notes (Signed)
CC'D TO PCP °

## 2016-03-26 NOTE — Patient Instructions (Signed)
We have scheduled you for a colonoscopy, upper endoscopy, and dilation with Dr. Gala Romney in the near future.  You may take Zantac as needed in the evening. After the upper endoscopy, we can make the choice regarding the best medication choice for you.

## 2016-03-26 NOTE — Telephone Encounter (Signed)
PA info faxed to [facility] for TCS/EGD/DIL.

## 2016-03-26 NOTE — Assessment & Plan Note (Signed)
71 year old male due for routine screening colonoscopy, without any concerning lower GI symptoms.   Proceed with TCS with Dr. Gala Romney in near future: the risks, benefits, and alternatives have been discussed with the patient in detail. The patient states understanding and desires to proceed.

## 2016-03-26 NOTE — Telephone Encounter (Signed)
Received fax to call [facility] for PA. Called [employer]. TCS/EGD/DIL was approved for 04/16/16 thru 07/14/16. PA# [id number].

## 2016-04-16 ENCOUNTER — Ambulatory Visit (HOSPITAL_COMMUNITY)
Admission: RE | Admit: 2016-04-16 | Discharge: 2016-04-16 | Disposition: A | Discharge status: Home / Self Care | Payer: Medicare HMO | Source: Ambulatory Visit | Attending: Internal Medicine | Admitting: Internal Medicine

## 2016-04-16 ENCOUNTER — Encounter (HOSPITAL_COMMUNITY)
Admission: RE | Disposition: A | Discharge status: Home / Self Care | Payer: Self-pay | Source: Ambulatory Visit | Attending: Internal Medicine

## 2016-04-16 ENCOUNTER — Encounter (HOSPITAL_COMMUNITY): Payer: Self-pay | Admitting: *Deleted

## 2016-04-16 DIAGNOSIS — K5730 Diverticulosis of large intestine without perforation or abscess without bleeding: Secondary | ICD-10-CM | POA: Insufficient documentation

## 2016-04-16 DIAGNOSIS — F0390 Unspecified dementia without behavioral disturbance: Secondary | ICD-10-CM | POA: Diagnosis not present

## 2016-04-16 DIAGNOSIS — I10 Essential (primary) hypertension: Secondary | ICD-10-CM | POA: Insufficient documentation

## 2016-04-16 DIAGNOSIS — Z79899 Other long term (current) drug therapy: Secondary | ICD-10-CM | POA: Diagnosis not present

## 2016-04-16 DIAGNOSIS — E7800 Pure hypercholesterolemia, unspecified: Secondary | ICD-10-CM | POA: Diagnosis not present

## 2016-04-16 DIAGNOSIS — Z1211 Encounter for screening for malignant neoplasm of colon: Secondary | ICD-10-CM | POA: Diagnosis not present

## 2016-04-16 DIAGNOSIS — K222 Esophageal obstruction: Secondary | ICD-10-CM | POA: Diagnosis not present

## 2016-04-16 DIAGNOSIS — K209 Esophagitis, unspecified: Secondary | ICD-10-CM

## 2016-04-16 DIAGNOSIS — Z87891 Personal history of nicotine dependence: Secondary | ICD-10-CM | POA: Diagnosis not present

## 2016-04-16 DIAGNOSIS — Z1212 Encounter for screening for malignant neoplasm of rectum: Secondary | ICD-10-CM

## 2016-04-16 DIAGNOSIS — R1310 Dysphagia, unspecified: Secondary | ICD-10-CM

## 2016-04-16 HISTORY — PX: ESOPHAGOGASTRODUODENOSCOPY: SHX5428

## 2016-04-16 HISTORY — PX: MALONEY DILATION: SHX5535

## 2016-04-16 HISTORY — PX: COLONOSCOPY: SHX5424

## 2016-04-16 SURGERY — COLONOSCOPY
Anesthesia: Moderate Sedation

## 2016-04-16 MED ORDER — LIDOCAINE VISCOUS 2 % MT SOLN
OROMUCOSAL | Status: AC
  Filled 2016-04-16: qty 15

## 2016-04-16 MED ORDER — ONDANSETRON HCL 4 MG/2ML IJ SOLN
INTRAMUSCULAR | Status: AC
  Filled 2016-04-16: qty 2

## 2016-04-16 MED ORDER — MEPERIDINE HCL 100 MG/ML IJ SOLN
INTRAMUSCULAR | Status: DC | PRN
Start: 2016-04-16 — End: 2016-04-16
  Administered 2016-04-16: 50 mg via INTRAVENOUS
  Administered 2016-04-16 (×3): 25 mg via INTRAVENOUS

## 2016-04-16 MED ORDER — LIDOCAINE VISCOUS 2 % MT SOLN
OROMUCOSAL | Status: DC | PRN
  Administered 2016-04-16: 1 via OROMUCOSAL

## 2016-04-16 MED ORDER — SODIUM CHLORIDE 0.9 % IV SOLN
INTRAVENOUS | Status: DC
  Administered 2016-04-16: 12:00:00 via INTRAVENOUS

## 2016-04-16 MED ORDER — ONDANSETRON HCL 4 MG/2ML IJ SOLN
INTRAMUSCULAR | Status: DC | PRN
  Administered 2016-04-16: 4 mg via INTRAVENOUS

## 2016-04-16 MED ORDER — MIDAZOLAM HCL 5 MG/5ML IJ SOLN
INTRAMUSCULAR | Status: AC
  Filled 2016-04-16: qty 10

## 2016-04-16 MED ORDER — MIDAZOLAM HCL 5 MG/5ML IJ SOLN
INTRAMUSCULAR | Status: DC | PRN
  Administered 2016-04-16 (×2): 1 mg via INTRAVENOUS
  Administered 2016-04-16 (×2): 2 mg via INTRAVENOUS

## 2016-04-16 MED ORDER — MEPERIDINE HCL 100 MG/ML IJ SOLN
INTRAMUSCULAR | Status: AC
  Filled 2016-04-16: qty 2

## 2016-04-16 NOTE — Discharge Instructions (Addendum)
°Colonoscopy °Discharge Instructions ° °Read the instructions outlined below and refer to this sheet in the next few weeks. These discharge instructions provide you with general information on caring for yourself after you leave the hospital. Your doctor may also give you specific instructions. While your treatment has been planned according to the most current medical practices available, unavoidable complications occasionally occur. If you have any problems or questions after discharge, call Dr. Rourk at [phone]. °ACTIVITY °· You may resume your regular activity, but move at a slower pace for the next 24 hours.  °· Take frequent rest periods for the next 24 hours.  °· Walking will help get rid of the air and reduce the bloated feeling in your belly (abdomen).  °· No driving for 24 hours (because of the medicine (anesthesia) used during the test).   °· Do not sign any important legal documents or operate any machinery for 24 hours (because of the anesthesia used during the test).  °NUTRITION °· Drink plenty of fluids.  °· You may resume your normal diet as instructed by your doctor.  °· Begin with a light meal and progress to your normal diet. Heavy or fried foods are harder to digest and may make you feel sick to your stomach (nauseated).  °· Avoid alcoholic beverages for 24 hours or as instructed.  °MEDICATIONS °· You may resume your normal medications unless your doctor tells you otherwise.  °WHAT YOU CAN EXPECT TODAY °· Some feelings of bloating in the abdomen.  °· Passage of more gas than usual.  °· Spotting of blood in your stool or on the toilet paper.  °IF YOU HAD POLYPS REMOVED DURING THE COLONOSCOPY: °· No aspirin products for 7 days or as instructed.  °· No alcohol for 7 days or as instructed.  °· Eat a soft diet for the next 24 hours.  °FINDING OUT THE RESULTS OF YOUR TEST °Not all test results are available during your visit. If your test results are not back during the visit, make an appointment  with your caregiver to find out the results. Do not assume everything is normal if you have not heard from your caregiver or the medical facility. It is important for you to follow up on all of your test results.  °SEEK IMMEDIATE MEDICAL ATTENTION IF: °· You have more than a spotting of blood in your stool.  °· Your belly is swollen (abdominal distention).  °· You are nauseated or vomiting.  °· You have a temperature over 101.  °· You have abdominal pain or discomfort that is severe or gets worse throughout the day.  °EGD °Discharge instructions °Please read the instructions outlined below and refer to this sheet in the next few weeks. These discharge instructions provide you with general information on caring for yourself after you leave the hospital. Your doctor may also give you specific instructions. While your treatment has been planned according to the most current medical practices available, unavoidable complications occasionally occur. If you have any problems or questions after discharge, please call your doctor. °ACTIVITY °· You may resume your regular activity but move at a slower pace for the next 24 hours.  °· Take frequent rest periods for the next 24 hours.  °· Walking will help expel (get rid of) the air and reduce the bloated feeling in your abdomen.  °· No driving for 24 hours (because of the anesthesia (medicine) used during the test).  °· You may shower.  °· Do not sign any important   legal documents or operate any machinery for 24 hours (because of the anesthesia used during the test).  NUTRITION  Drink plenty of fluids.   You may resume your normal diet.   Begin with a light meal and progress to your normal diet.   Avoid alcoholic beverages for 24 hours or as instructed by your caregiver.  MEDICATIONS  You may resume your normal medications unless your caregiver tells you otherwise.  WHAT YOU CAN EXPECT TODAY  You may experience abdominal discomfort such as a feeling of fullness  or gas pains.  FOLLOW-UP  Your doctor will discuss the results of your test with you.  SEEK IMMEDIATE MEDICAL ATTENTION IF ANY OF THE FOLLOWING OCCUR:  Excessive nausea (feeling sick to your stomach) and/or vomiting.   Severe abdominal pain and distention (swelling).   Trouble swallowing.   Temperature over 101 F (37.8 C).   Rectal bleeding or vomiting of blood.     GERD and diverticulosis information provided  I do not recommend any future colonoscopy was new symptoms develop  Begin Protonix 20 mg daily to every other day to control GERD  Office visit with [place] in one year  Diverticulosis Diverticulosis is the condition that develops when small pouches (diverticula) form in the wall of your colon. Your colon, or large intestine, is where water is absorbed and stool is formed. The pouches form when the inside layer of your colon pushes through weak spots in the outer layers of your colon. CAUSES  No one knows exactly what causes diverticulosis. RISK FACTORS  Being older than 81. Your risk for this condition increases with age. Diverticulosis is rare in people younger than 40 years. By age 65, almost everyone has it.  Eating a low-fiber diet.  Being frequently constipated.  Being overweight.  Not getting enough exercise.  Smoking.  Taking over-the-counter pain medicines, like aspirin and ibuprofen. SYMPTOMS  Most people with diverticulosis do not have symptoms. DIAGNOSIS  Because diverticulosis often has no symptoms, health care providers often discover the condition during an exam for other colon problems. In many cases, a health care provider will diagnose diverticulosis while using a flexible scope to examine the colon (colonoscopy). TREATMENT  If you have never developed an infection related to diverticulosis, you may not need treatment. If you have had an infection before, treatment may include:  Eating more fruits, vegetables, and grains.  Taking a fiber  supplement.  Taking a live bacteria supplement (probiotic).  Taking medicine to relax your colon. HOME CARE INSTRUCTIONS   Drink at least 6-8 glasses of water each day to prevent constipation.  Try not to strain when you have a bowel movement.  Keep all follow-up appointments. If you have had an infection before:  Increase the fiber in your diet as directed by your health care provider or dietitian.  Take a dietary fiber supplement if your health care provider approves.  Only take medicines as directed by your health care provider. SEEK MEDICAL CARE IF:   You have abdominal pain.  You have bloating.  You have cramps.  You have not gone to the bathroom in 3 days. SEEK IMMEDIATE MEDICAL CARE IF:   Your pain gets worse.  Yourbloating becomes very bad.  You have a fever or chills, and your symptoms suddenly get worse.  You begin vomiting.  You have bowel movements that are bloody or black. MAKE SURE YOU:  Understand these instructions.  Will watch your condition.  Will get help right away if you  are not doing well or get worse. This information is not intended to replace advice given to you by your health care provider. Make sure you discuss any questions you have with your health care provider. Document Released: 11/28/2003 Document Revised: 03/07/2013 Document Reviewed: 01/25/2013 Elsevier Interactive Patient Education  2017 [place] for Gastroesophageal Reflux Disease, Adult When you have gastroesophageal reflux disease (GERD), the foods you eat and your eating habits are very important. Choosing the right foods can help ease the discomfort of GERD. What general guidelines do I need to follow?  Choose fruits, vegetables, whole grains, low-fat dairy products, and low-fat meat, fish, and poultry.  Limit fats such as oils, salad dressings, butter, nuts, and avocado.  Keep a food diary to identify foods that cause symptoms.  Avoid foods  that cause reflux. These may be different for different people.  Eat frequent small meals instead of three large meals each day.  Eat your meals slowly, in a relaxed setting.  Limit fried foods.  Cook foods using methods other than frying.  Avoid drinking alcohol.  Avoid drinking large amounts of liquids with your meals.  Avoid bending over or lying down until 2-3 hours after eating. What foods are not recommended? The following are some foods and drinks that may worsen your symptoms: Vegetables  Tomatoes. Tomato juice. Tomato and spaghetti sauce. Chili peppers. Onion and garlic. Horseradish. Fruits  Oranges, grapefruit, and lemon (fruit and juice). Meats  High-fat meats, fish, and poultry. This includes hot dogs, ribs, ham, sausage, salami, and bacon. Dairy  Whole milk and chocolate milk. Sour cream. Cream. Butter. Ice cream. Cream cheese. Beverages  Coffee and tea, with or without caffeine. Carbonated beverages or energy drinks. Condiments  Hot sauce. Barbecue sauce. Sweets/Desserts  Chocolate and cocoa. Donuts. Peppermint and spearmint. Fats and Oils  High-fat foods, including [place] fries and potato chips. Other  Vinegar. Strong spices, such as black pepper, white pepper, red pepper, cayenne, curry powder, cloves, ginger, and chili powder. The items listed above may not be a complete list of foods and beverages to avoid. Contact your dietitian for more information.  This information is not intended to replace advice given to you by your health care provider. Make sure you discuss any questions you have with your health care provider. Document Released: 03/02/2005 Document Revised: 08/08/2015 Document Reviewed: 01/04/2013 Elsevier Interactive Patient Education  2017 [employer].

## 2016-04-16 NOTE — Op Note (Signed)
[facility] Patient Name: Javier Morgan Procedure Date: 04/16/2016 12:06 PM MRN: [id number] Date of Birth: Oct 14, 1945 Attending MD: Norvel Richards , MD CSN: [id number] Age: 71 Admit Type: Outpatient Procedure:                Upper GI endoscopy with Venia Minks dilation Indications:              Dysphagia Providers:                Norvel Richards, MD, Tammy Vaught, RN, Aram Candela, Lurline Del, RN Referring MD:              Medicines:                Midazolam 6 mg IV, Meperidine 125 mg IV,                            Ondansetron 4 mg IV Complications:            No immediate complications. Estimated Blood Loss:     Estimated blood loss was minimal. Estimated blood                            loss was minimal. Procedure:                Pre-Anesthesia Assessment:                           - Prior to the procedure, a History and Physical                            was performed, and patient medications and                            allergies were reviewed. The patient's tolerance of                            previous anesthesia was also reviewed. The risks                            and benefits of the procedure and the sedation                            options and risks were discussed with the patient.                            All questions were answered, and informed consent                            was obtained. Prior Anticoagulants: The patient has                            taken no previous anticoagulant or antiplatelet  agents. ASA Grade Assessment: II - A patient with                            mild systemic disease. After reviewing the risks                            and benefits, the patient was deemed in                            satisfactory condition to undergo the procedure.                           After obtaining informed consent, the endoscope was                            passed under direct  vision. Throughout the                            procedure, the patient's blood pressure, pulse, and                            oxygen saturations were monitored continuously. The                            [id number]) scope was introduced through the                            mouth, and advanced to the upper third of                            esophagus. The upper GI endoscopy was accomplished                            without difficulty. The patient tolerated the                            procedure well. Scope In: 12:48:09 PM Scope Out: 12:59:25 PM Total Procedure Duration: 0 hours 11 minutes 16 seconds  Findings:      LA Grade A (one or more mucosal breaks less than 5 mm, not extending       between tops of 2 mucosal folds) esophagitis was found 35 to 37 cm from       the incisors. No Barrett's epithelium. Schatzki's ring present. The       scope was withdrawn. Dilation was performed with a Maloney dilator with       no resistance at 56 Fr. Dilation was performed with a Maloney dilator       with mild resistance at 58 Fr. The dilation site was examined following       endoscope reinsertion and showed mild improvement in luminal narrowing.       Estimated blood loss was minimal.      The entire examined stomach was normal.      The duodenal bulb and second portion of the duodenum were normal.       Estimated blood loss was minimal. Impression:               -  LA Grade A esophagitis.                           - No specimens collected. Moderate Sedation:      Moderate (conscious) sedation was administered by the endoscopy nurse       and supervised by the endoscopist. The following parameters were       monitored: oxygen saturation, heart rate, blood pressure, respiratory       rate, EKG, adequacy of pulmonary ventilation, and response to care.       Total physician intraservice time was 16 minutes. Recommendation:           - Patient has a contact number available for                             emergencies. The signs and symptoms of potential                            delayed complications were discussed with the                            patient. Return to normal activities tomorrow.                            Written discharge instructions were provided to the                            patient.                           - Resume previous diet.                           - Continue present medications.                           - Use Protonix (pantoprazole) 20 mg PO daily to                            every other day to control reflux symptoms as                            discussed with patient's wife. See colonoscopy                            report Procedure Code(s):        --- Professional ---                           [id number], Esophagoscopy, flexible, transoral;                            diagnostic, including collection of specimen(s) by                            brushing or washing, when performed (separate  procedure)                           43450, Dilation of esophagus, by unguided sound or                            bougie, single or multiple passes                           99152, Moderate sedation services provided by the                            same physician or other qualified health care                            professional performing the diagnostic or                            therapeutic service that the sedation supports,                            requiring the presence of an independent trained                            observer to assist in the monitoring of the                            patient's level of consciousness and physiological                            status; initial 15 minutes of intraservice time,                            patient age 62 years or older Diagnosis Code(s):        --- Professional ---                           K20.9, Esophagitis, unspecified                            R13.10, Dysphagia, unspecified CPT copyright 2016 American Medical Association. All rights reserved. The codes documented in this report are preliminary and upon coder review may  be revised to meet current compliance requirements. [name]. [name], MD Norvel Richards, MD 04/16/2016 1:30:48 PM This report has been signed electronically. Number of Addenda: 0

## 2016-04-16 NOTE — H&P (View-Only) (Signed)
Primary Care Physician:  Asencion Noble, MD Primary Gastroenterologist:  Dr. Gala Romney   Chief Complaint  Patient presents with  . Colonoscopy  . EGD    HPI:   Javier Morgan is a 71 y.o. male presenting today to schedule routine 10-year-screening colonoscopy but was brought in due to reports of dysphagia when triaged.    Colonoscopy in 2006 per patient by Dr. Gala Romney. Reports remote history of dilation, over 10 years ago. Dysphagia present for "quite some time". Worse with steak. No liquid dysphagia. No abdominal pain. No rectal bleeding. No constipation or diarrhea. Has been on omeprazole historically. Occasionally reflux. Using tums prn. Happens only depending on what he eats and how much he eats. No unexplained weight loss or lack of appetite. Hesitant to take a PPI due to news reports.    Past Medical History:  Diagnosis Date  . Dementia   . Hypercholesteremia   . Hypertension     Past Surgical History:  Procedure Laterality Date  . COLONOSCOPY  2006   records not available at time of appt   . ESOPHAGOGASTRODUODENOSCOPY     remote past, with dilation     Current Outpatient Prescriptions  Medication Sig Dispense Refill  . amLODipine (NORVASC) 10 MG tablet Take 10 mg by mouth daily.    Marland Kitchen atorvastatin (LIPITOR) 80 MG tablet Take 80 mg by mouth at bedtime.    . donepezil (ARICEPT) 10 MG tablet Take 10 mg by mouth at bedtime.    Marland Kitchen escitalopram (LEXAPRO) 10 MG tablet Take 10 mg by mouth daily.    Marland Kitchen levothyroxine (SYNTHROID, LEVOTHROID) 150 MCG tablet Take 175 mcg by mouth daily before breakfast.     . lisinopril (PRINIVIL,ZESTRIL) 40 MG tablet Take 40 mg by mouth daily.    . meclizine (ANTIVERT) 25 MG tablet Take 25 mg by mouth 3 (three) times daily.    . vitamin B-12 (CYANOCOBALAMIN) 1000 MCG tablet Take 1,000 mcg by mouth daily.     No current facility-administered medications for this visit.     Allergies as of 03/26/2016  . (No Known Allergies)    Family History    Problem Relation Age of Onset  . Colon cancer Neg Hx     Social History   Social History  . Marital status: Married    Spouse name: N/A  . Number of children: N/A  . Years of education: N/A   Occupational History  . Not on file.   Social History Main Topics  . Smoking status: Former Research scientist (life sciences)  . Smokeless tobacco: Not on file     Comment: quit about 30-45 years ago   . Alcohol use No  . Drug use: No  . Sexual activity: Not on file   Other Topics Concern  . Not on file   Social History Narrative  . No narrative on file    Review of Systems: Gen: Denies any fever, chills, fatigue, weight loss, lack of appetite.  CV: Denies chest pain, heart palpitations, peripheral edema, syncope.  Resp: Denies shortness of breath at rest or with exertion. Denies wheezing or cough.  GI: see HPI  GU : Denies urinary burning, urinary frequency, urinary hesitancy MS: +joint pain  Derm: Denies rash, itching, dry skin Psych: Denies depression, anxiety, memory loss, and confusion Heme: see HPI   Physical Exam: BP (!) 157/76   Pulse (!) 57   Temp 97.6 F (36.4 C) (Oral)   Ht 6\' 3"  (1.905 m)   Wt 252  lb (114.3 kg)   BMI 31.50 kg/m  General:   Alert and oriented. Pleasant and cooperative. Well-nourished and well-developed.  Head:  Normocephalic and atraumatic. Eyes:  Without icterus, sclera clear and conjunctiva pink.  Ears:  Normal auditory acuity. Nose:  No deformity, discharge,  or lesions. Lungs:  Clear to auscultation bilaterally. No wheezes, rales, or rhonchi. No distress.  Heart:  S1, S2 present without murmurs appreciated.  Abdomen:  +BS, soft, non-tender and non-distended. No HSM noted. No guarding or rebound. No masses appreciated.  Rectal:  Deferred  Msk:  Symmetrical without gross deformities. Normal posture. Extremities:  Without  edema. Neurologic:  Alert and  oriented x4;  grossly normal neurologically. Psych:  Alert and cooperative. Normal mood and affect.

## 2016-04-16 NOTE — Op Note (Signed)
[facility] Patient Name: Javier Morgan Procedure Date: 04/16/2016 1:01 PM MRN: [id number] Date of Birth: Sep 08, 1945 Attending MD: Norvel Richards , MD CSN: [id number] Age: 71 Admit Type: Outpatient Procedure:                Colonoscopy Indications:              Screening for colorectal malignant neoplasm Providers:                Norvel Richards, MD, Lurline Del, RN, Aram Candela Referring MD:              Medicines:                Midazolam 6 mg IV, Meperidine 125 mg IV,                            Ondansetron 4 mg IV Complications:            No immediate complications. Estimated Blood Loss:     Estimated blood loss: none. Procedure:                Pre-Anesthesia Assessment:                           - Prior to the procedure, a History and Physical                            was performed, and patient medications and                            allergies were reviewed. The patient's tolerance of                            previous anesthesia was also reviewed. The risks                            and benefits of the procedure and the sedation                            options and risks were discussed with the patient.                            All questions were answered, and informed consent                            was obtained. Prior Anticoagulants: The patient has                            taken no previous anticoagulant or antiplatelet                            agents. ASA Grade Assessment: II - A patient with  mild systemic disease. After reviewing the risks                            and benefits, the patient was deemed in                            satisfactory condition to undergo the procedure.                           After obtaining informed consent, the colonoscope                            was passed under direct vision. Throughout the                            procedure, the patient's blood  pressure, pulse, and                            oxygen saturations were monitored continuously. The                            EC-[id number] scope was introduced through                            the anus and advanced to the the cecum, identified                            by appendiceal orifice and ileocecal valve. The                            ileocecal valve, appendiceal orifice, and rectum                            were photographed. The entire colon was well                            visualized. The colonoscopy was performed without                            difficulty. The patient tolerated the procedure                            well. The quality of the bowel preparation was                            adequate. Scope In: 1:04:55 PM Scope Out: 1:21:03 PM Scope Withdrawal Time: 0 hours 9 minutes 45 seconds  Total Procedure Duration: 0 hours 16 minutes 8 seconds  Findings:      The perianal and digital rectal examinations were normal.      Scattered small and large-mouthed diverticula were found in the sigmoid       colon.      The exam was otherwise without abnormality on direct and retroflexion       views. Impression:               -  Diverticulosis in the sigmoid colon.                           - The examination was otherwise normal on direct                            and retroflexion views.                           - No specimens collected. Moderate Sedation:      Moderate (conscious) sedation was administered by the endoscopy nurse       and supervised by the endoscopist. The following parameters were       monitored: oxygen saturation, heart rate, blood pressure, respiratory       rate, EKG, adequacy of pulmonary ventilation, and response to care.       Total physician intraservice time was 15 minutes. Recommendation:           - Patient has a contact number available for                            emergencies. The signs and symptoms of potential                             delayed complications were discussed with the                            patient. Return to normal activities tomorrow.                            Written discharge instructions were provided to the                            patient.                           - Resume previous diet.                           - Continue present medications.                           - No repeat colonoscopy due to age.                           - Return to GI office in 1 year. See EGD report. Procedure Code(s):        --- Professional ---                           [phone], Colonoscopy, flexible; diagnostic, including                            collection of specimen(s) by brushing or washing,                            when performed (separate procedure)  [id number], Moderate sedation services provided by the                            same physician or other qualified health care                            professional performing the diagnostic or                            therapeutic service that the sedation supports,                            requiring the presence of an independent trained                            observer to assist in the monitoring of the                            patient's level of consciousness and physiological                            status; initial 15 minutes of intraservice time,                            patient age 75 years or older Diagnosis Code(s):        --- Professional ---                           Z12.11, Encounter for screening for malignant                            neoplasm of colon                           K57.30, Diverticulosis of large intestine without                            perforation or abscess without bleeding CPT copyright 2016 [employer]. All rights reserved. The codes documented in this report are preliminary and upon coder review may  be revised to meet current compliance  requirements. [name]. Nevia Henkin, MD Norvel Richards, MD 04/16/2016 1:34:36 PM This report has been signed electronically. Number of Addenda: 0

## 2016-04-16 NOTE — Interval H&P Note (Signed)
History and Physical Interval Note:  04/16/2016 12:37 PM  Javier Morgan  has presented today for surgery, with the diagnosis of screening colonoscopy, dysphagia  The various methods of treatment have been discussed with the patient and family. After consideration of risks, benefits and other options for treatment, the patient has consented to  Procedure(s) with comments: COLONOSCOPY (N/A) - 1:00 pm ESOPHAGOGASTRODUODENOSCOPY (EGD) (N/A) MALONEY DILATION (N/A) as a surgical intervention .  The patient's history has been reviewed, patient examined, no change in status, stable for surgery.  I have reviewed the patient's chart and labs.  Questions were answered to the patient's satisfaction.     Robert Rourk  No change. EGD/ED and screening colonoscopy per plan.  The risks, benefits, limitations, imponderables and alternatives regarding both EGD and colonoscopy have been reviewed with the patient. Questions have been answered. All parties agreeable.

## 2016-04-23 ENCOUNTER — Encounter (HOSPITAL_COMMUNITY): Payer: Self-pay | Admitting: Internal Medicine

## 2016-06-24 DIAGNOSIS — E039 Hypothyroidism, unspecified: Secondary | ICD-10-CM | POA: Diagnosis not present

## 2016-07-02 DIAGNOSIS — E039 Hypothyroidism, unspecified: Secondary | ICD-10-CM | POA: Diagnosis not present

## 2016-07-02 DIAGNOSIS — I10 Essential (primary) hypertension: Secondary | ICD-10-CM | POA: Diagnosis not present

## 2016-09-11 ENCOUNTER — Telehealth: Payer: Self-pay

## 2016-09-11 MED ORDER — PANTOPRAZOLE SODIUM 20 MG PO TBEC: 20.0000 mg | DELAYED_RELEASE_TABLET | Freq: Every day | ORAL | 3 refills | Status: DC

## 2016-09-11 NOTE — Telephone Encounter (Signed)
Done

## 2016-09-11 NOTE — Telephone Encounter (Signed)
Pt is wanting to have a Rx sent to [facility] mail order. He is taking Protonix 20 mg every other day

## 2016-09-19 IMAGING — MR MR HEAD W/O CM
10 of 12 series · 33 of 48 positions shown · non-contrast
Comparison: None.

CLINICAL DATA: Memory loss.  Family history of dementia.

EXAM:
MRI HEAD WITHOUT CONTRAST
TECHNIQUE: Multiplanar, multiecho pulse sequences of the brain and surrounding
structures were obtained without intravenous contrast.

[Series 3: T1 · sagittal · 5.0mm · 0.45mm/px · 1 of 21 slices shown (1 of 2)]
[im 1/21]
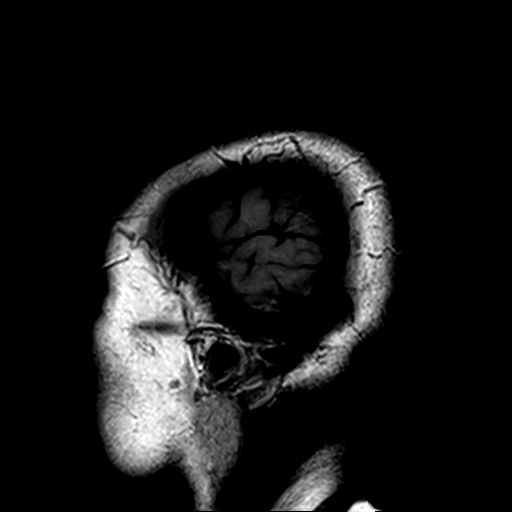

[Series 6: T2 · axial · 5.0mm · 0.51mm/px · z∈[-34,+109]mm · 2 of 23 slices shown (1 of 2)]
[im 1/23]
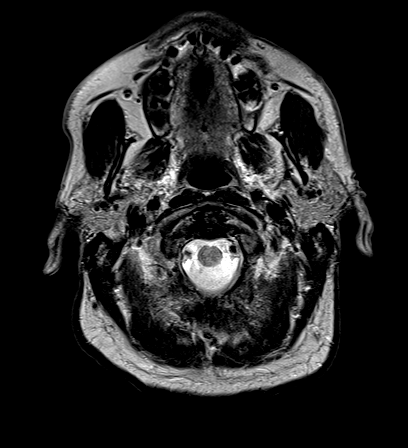
[im 23/23]
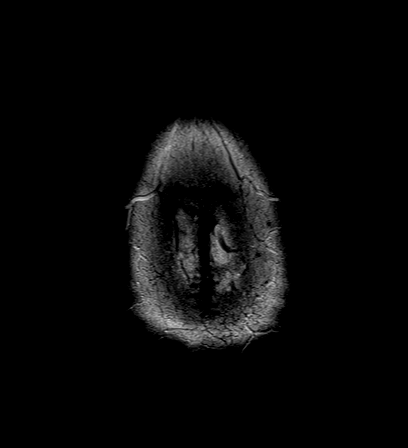

[Series 7: FLAIR · axial · 5.0mm · 0.35mm/px · z∈[-34,+108]mm · 2 of 23 slices shown]
[im 1/23]
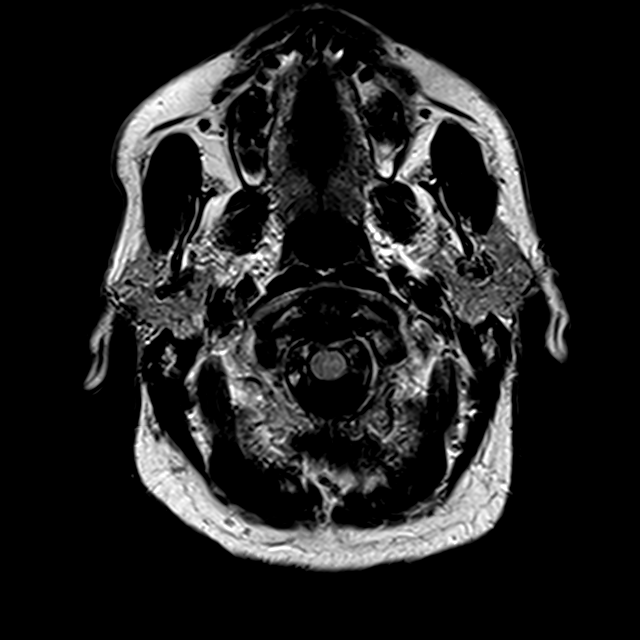
[im 23/23]
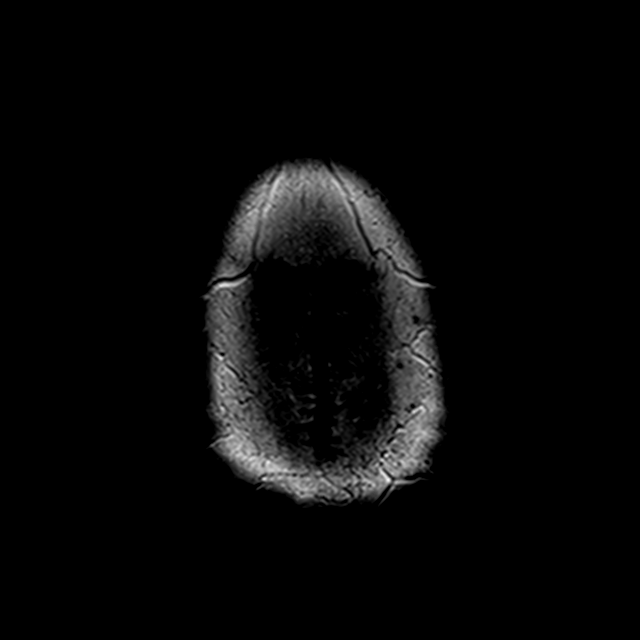

[Series 8: T1 · axial · 2.0mm · 0.44mm/px · z∈[-50,+110]mm · 7 of 81 slices shown (2 of 2)]
[im 1/81]
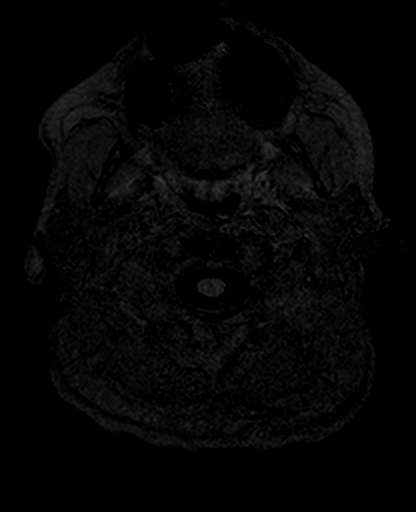
[im 14/81]
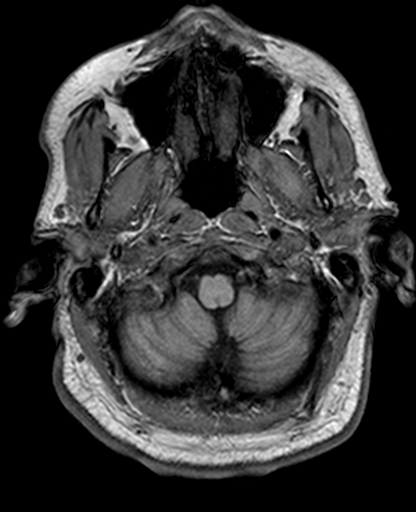
[im 27/81]
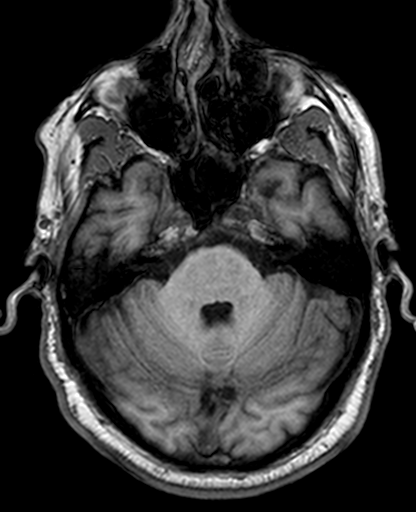
[im 41/81]
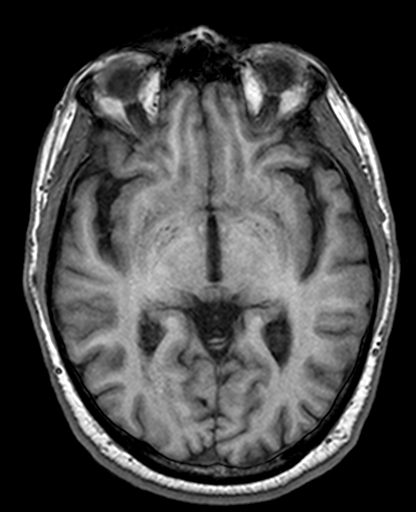
[im 54/81]
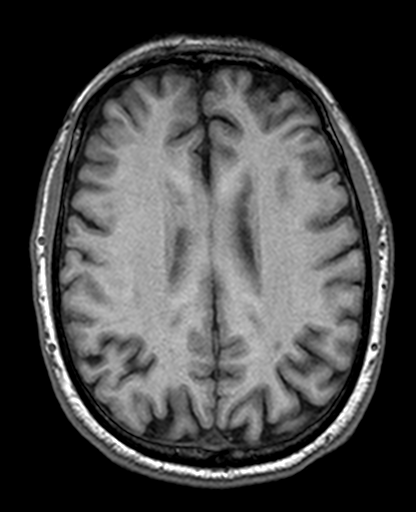
[im 67/81]
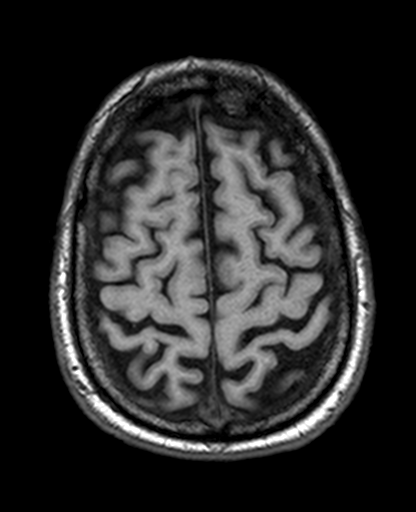
[im 81/81]
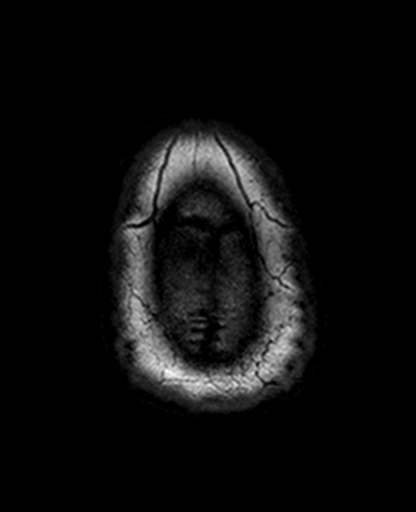

[Series 9: trauma axial · axial · 5.0mm · 0.44mm/px · z∈[-34,+109]mm · 2 of 23 slices shown]
[im 1/23]
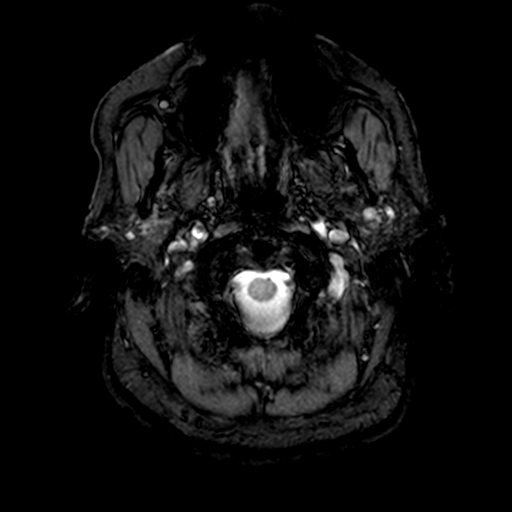
[im 23/23]
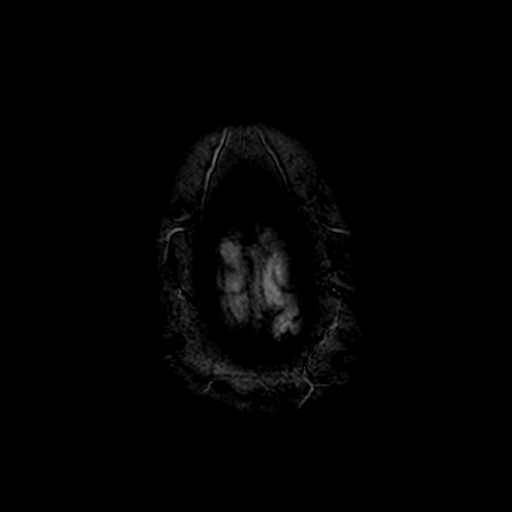

[Series 10: T2 · coronal · 5.0mm · 0.44mm/px · 2 of 28 slices shown (2 of 2)]
[im 1/28]
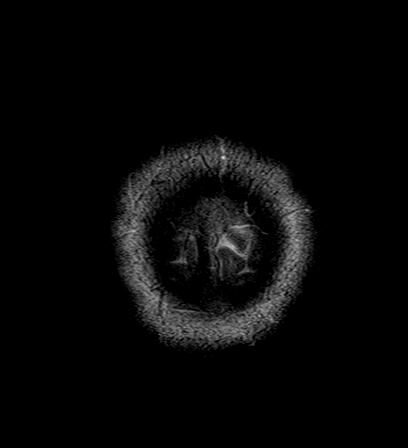
[im 28/28]
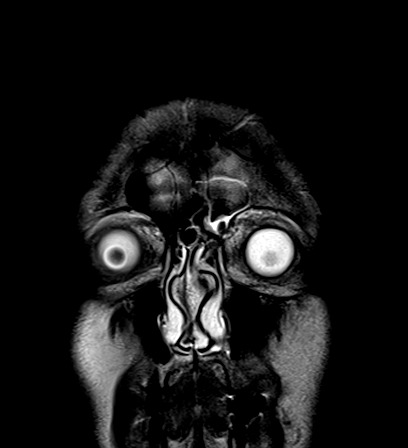

[Series 100: <mpr thick range> · axial · 3.0mm · 0.76mm/px · z∈[-35,+110]mm · 4 of 49 slices shown]
[im 1/49]
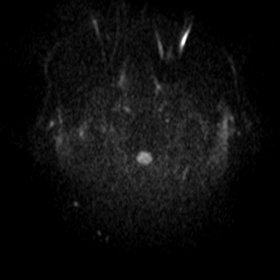
[im 17/49]
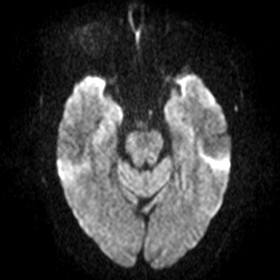
[im 33/49]
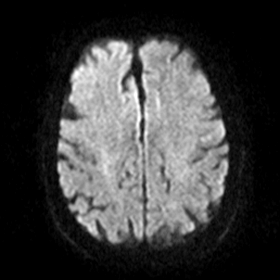
[im 49/49]
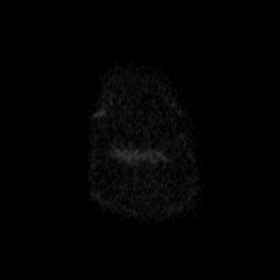

[Series 101: <mpr thick range(1)> · coronal · 3.0mm · 0.62mm/px · 5 of 65 slices shown]
[im 1/65]
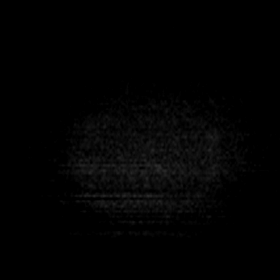
[im 17/65]
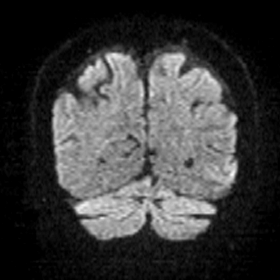
[im 33/65]
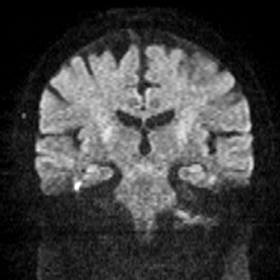
[im 49/65]
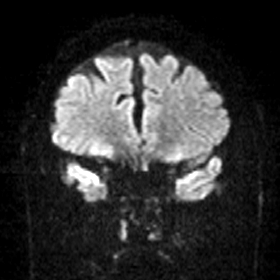
[im 65/65]
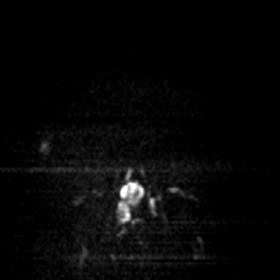

[Series 102: <mpr thick range(2)> · axial · 3.0mm · 0.76mm/px · z∈[-35,+110]mm · 4 of 49 slices shown]
[im 1/49]
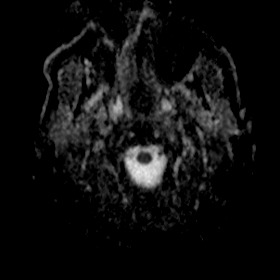
[im 17/49]
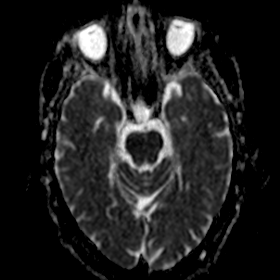
[im 33/49]
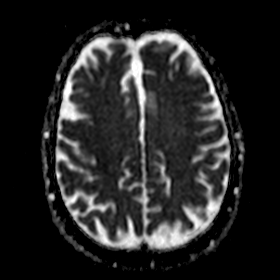
[im 49/49]
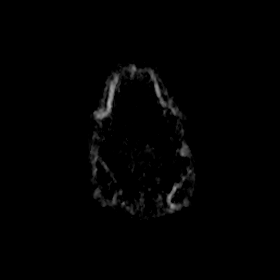

[Series 103: <mpr thick range(3)> · coronal · 3.0mm · 0.62mm/px · 4 of 64 slices shown]
[im 1/64]
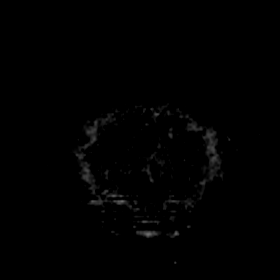
[im 16/64]
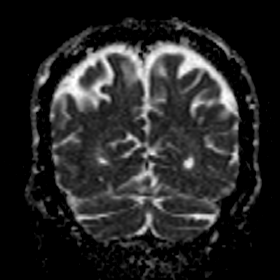
[im 32/64]
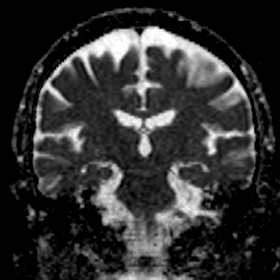
[im 48/64]
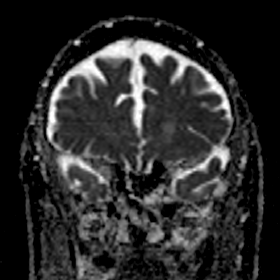

[33 of 48 positions shown; findings below may reference images not displayed]

FINDINGS: Diffusion imaging does not show any acute or subacute infarction.
There is some age related volume loss, not advanced. No abnormality
is seen affecting the brainstem or cerebellum. Within the cerebral
hemispheres, there are chronic small-vessel ischemic changes of the
white matter, most notable in the periatrial white matter. No
cortical or large vessel territory infarction. No mass lesion,
hemorrhage, hydrocephalus or extra-axial collection. No pituitary
mass. There are mucosal inflammatory changes of the sphenoid sinus.
Major vessels at the base of the brain show flow.
IMPRESSION: No acute or reversible finding. Chronic small-vessel ischemic
changes of the cerebral hemispheric white matter, most notable in
the periatrial white matter.

## 2016-10-01 DIAGNOSIS — E039 Hypothyroidism, unspecified: Secondary | ICD-10-CM | POA: Diagnosis not present

## 2016-10-08 DIAGNOSIS — I10 Essential (primary) hypertension: Secondary | ICD-10-CM | POA: Diagnosis not present

## 2016-10-08 DIAGNOSIS — E039 Hypothyroidism, unspecified: Secondary | ICD-10-CM | POA: Diagnosis not present

## 2017-01-08 DIAGNOSIS — Z79899 Other long term (current) drug therapy: Secondary | ICD-10-CM | POA: Diagnosis not present

## 2017-01-08 DIAGNOSIS — E039 Hypothyroidism, unspecified: Secondary | ICD-10-CM | POA: Diagnosis not present

## 2017-01-14 DIAGNOSIS — Z23 Encounter for immunization: Secondary | ICD-10-CM | POA: Diagnosis not present

## 2017-01-14 DIAGNOSIS — E039 Hypothyroidism, unspecified: Secondary | ICD-10-CM | POA: Diagnosis not present

## 2017-03-23 ENCOUNTER — Encounter: Payer: Self-pay | Admitting: Internal Medicine

## 2017-03-25 DIAGNOSIS — I10 Essential (primary) hypertension: Secondary | ICD-10-CM | POA: Diagnosis not present

## 2017-03-25 DIAGNOSIS — E785 Hyperlipidemia, unspecified: Secondary | ICD-10-CM | POA: Diagnosis not present

## 2017-03-25 DIAGNOSIS — Z79899 Other long term (current) drug therapy: Secondary | ICD-10-CM | POA: Diagnosis not present

## 2017-03-25 DIAGNOSIS — K219 Gastro-esophageal reflux disease without esophagitis: Secondary | ICD-10-CM | POA: Diagnosis not present

## 2017-03-25 DIAGNOSIS — E039 Hypothyroidism, unspecified: Secondary | ICD-10-CM | POA: Diagnosis not present

## 2017-04-01 DIAGNOSIS — Z23 Encounter for immunization: Secondary | ICD-10-CM | POA: Diagnosis not present

## 2017-04-01 DIAGNOSIS — R001 Bradycardia, unspecified: Secondary | ICD-10-CM | POA: Diagnosis not present

## 2017-04-01 DIAGNOSIS — Z0001 Encounter for general adult medical examination with abnormal findings: Secondary | ICD-10-CM | POA: Diagnosis not present

## 2017-04-01 DIAGNOSIS — Z6833 Body mass index (BMI) 33.0-33.9, adult: Secondary | ICD-10-CM | POA: Diagnosis not present

## 2017-04-01 DIAGNOSIS — S61215A Laceration without foreign body of left ring finger without damage to nail, initial encounter: Secondary | ICD-10-CM | POA: Diagnosis not present

## 2017-05-28 ENCOUNTER — Encounter: Payer: Self-pay | Admitting: Internal Medicine

## 2017-05-28 ENCOUNTER — Ambulatory Visit: Payer: Medicare HMO | Admitting: Internal Medicine

## 2017-05-28 VITALS — BP 144/84 | HR 75 | Temp 97.2°F | Ht 75.0 in | Wt 257.8 lb

## 2017-05-28 DIAGNOSIS — K2100 Gastro-esophageal reflux disease with esophagitis, without bleeding: Secondary | ICD-10-CM

## 2017-05-28 DIAGNOSIS — R1310 Dysphagia, unspecified: Secondary | ICD-10-CM

## 2017-05-28 DIAGNOSIS — R1319 Other dysphagia: Secondary | ICD-10-CM

## 2017-05-28 NOTE — Progress Notes (Signed)
Primary Care Physician:  Asencion Noble, MD Primary Gastroenterologist:  Dr. Gala Romney  Pre-Procedure History & Physical: HPI:  Javier Morgan is a 72 y.o. male here for follow-up of erosive reflux esophagitis/dysphagia. EGD with dilation last year has resulted in sustained resolution of dysphagia symptoms. He did not have a stricture. No future colonoscopy warranted given age. Reflux symptoms well controlled on Protonix 40 mg every other day.  Past Medical History:  Diagnosis Date  . Dementia   . Hypercholesteremia   . Hypertension     Past Surgical History:  Procedure Laterality Date  . COLONOSCOPY  2006   records not available at time of appt   . COLONOSCOPY N/A 04/16/2016   Procedure: COLONOSCOPY;  Surgeon: Daneil Dolin, MD;  [facility]: [facility];  Service: Endoscopy;  Laterality: N/A;  1:00 pm  . ESOPHAGOGASTRODUODENOSCOPY     remote past, with dilation   . ESOPHAGOGASTRODUODENOSCOPY N/A 04/16/2016   Procedure: ESOPHAGOGASTRODUODENOSCOPY (EGD);  Surgeon: Daneil Dolin, MD;  Location: [facility];  Service: Endoscopy;  Laterality: N/A;  . Venia Minks DILATION N/A 04/16/2016   Procedure: Venia Minks DILATION;  Surgeon: Daneil Dolin, MD;  Location: [facility];  Service: Endoscopy;  Laterality: N/A;  . TONSILLECTOMY      Prior to Admission medications   Medication Sig Start Date End Date Taking? Authorizing Provider  amLODipine (NORVASC) 10 MG tablet Take 10 mg by mouth daily.   Yes [provider]  atorvastatin (LIPITOR) 80 MG tablet Take 80 mg by mouth at bedtime.   Yes [provider]  donepezil (ARICEPT) 10 MG tablet Take 10 mg by mouth at bedtime.   Yes [provider]  escitalopram (LEXAPRO) 10 MG tablet Take 10 mg by mouth at bedtime.    Yes [provider]  levothyroxine (SYNTHROID, LEVOTHROID) 200 MCG tablet Take 1 tablet by mouth daily. 04/19/17  Yes [provider]  lisinopril (PRINIVIL,ZESTRIL) 40 MG tablet Take 40 mg by mouth  daily.   Yes [provider]  pantoprazole (PROTONIX) 20 MG tablet Take 1 tablet (20 mg total) by mouth daily. 09/11/16  Yes Annitta Needs, NP  vitamin B-12 (CYANOCOBALAMIN) 1000 MCG tablet Take 1,000 mcg by mouth daily.   Yes [provider]  levothyroxine (SYNTHROID, LEVOTHROID) 175 MCG tablet Take 175 mcg by mouth at bedtime. 03/24/16   [provider]  polyethylene glycol-electrolytes (TRILYTE) 420 g solution Take 4,000 mLs by mouth as directed. Patient not taking: Reported on 05/28/2017 03/26/16   Daneil Dolin, MD    Allergies as of 05/28/2017  . (No Known Allergies)    Family History  Problem Relation Age of Onset  . Colon cancer Neg Hx     Social History   Socioeconomic History  . Marital status: Married    Spouse name: Not on file  . Number of children: Not on file  . Years of education: Not on file  . Highest education level: Not on file  Social Needs  . Financial resource strain: Not on file  . Food insecurity - worry: Not on file  . Food insecurity - inability: Not on file  . Transportation needs - medical: Not on file  . Transportation needs - non-medical: Not on file  Occupational History  . Not on file  Tobacco Use  . Smoking status: Former Research scientist (life sciences)  . Smokeless tobacco: Never Used  . Tobacco comment: quit about 30-45 years ago   Substance and Sexual Activity  .  Alcohol use: No  . Drug use: No  . Sexual activity: Not on file  Other Topics Concern  . Not on file  Social History Narrative  . Not on file    Review of Systems: See HPI, otherwise negative ROS  Physical Exam: BP (!) 144/84   Pulse 75   Temp (!) 97.2 F (36.2 C) (Oral)   Ht 6\' 3"  (1.905 m)   Wt 257 lb 12.8 oz (116.9 kg)   BMI 32.22 kg/m  General:   Alert,   pleasant and cooperative in NAD;  Accompanied by spouse. Neck:  Supple; no masses or thyromegaly. No significant cervical adenopathy. Lungs:  Clear throughout to auscultation.   No wheezes, crackles, or  rhonchi. No acute distress. Heart:  Regular rate and rhythm; no murmurs, clicks, rubs,  or gallops. Abdomen: Non-distended, normal bowel sounds.  Soft and nontender without appreciable mass or hepatosplenomegaly.  Pulses:  Normal pulses noted. Extremities:  Without clubbing or edema.  Impression: Very pleasant 72 year old gentleman with a history of erosive reflux esophagitis/dysphagia now doing well on a every other day regimen of Protonix. I discussed the potential long-term risk of PPI therapy. No need for future colonoscopy unless new symptoms develop.   Recommendations:  Continue protonix 40 mg every other day  GERD information  Watch weight gain  Office visit in 2 years and as needed       Notice: This dictation was prepared with Dragon dictation along with smaller phrase technology. Any transcriptional errors that result from this process are unintentional and may not be corrected upon review.

## 2017-05-28 NOTE — Patient Instructions (Signed)
Continue protonix 40 mg every other day  GERD information  Watch weight gain  Office visit in 2 years and as needed

## 2017-08-05 DIAGNOSIS — F3340 Major depressive disorder, recurrent, in remission, unspecified: Secondary | ICD-10-CM | POA: Diagnosis not present

## 2017-08-05 DIAGNOSIS — E039 Hypothyroidism, unspecified: Secondary | ICD-10-CM | POA: Diagnosis not present

## 2017-08-05 DIAGNOSIS — I10 Essential (primary) hypertension: Secondary | ICD-10-CM | POA: Diagnosis not present

## 2017-08-10 DIAGNOSIS — H40033 Anatomical narrow angle, bilateral: Secondary | ICD-10-CM | POA: Diagnosis not present

## 2017-08-10 DIAGNOSIS — H40023 Open angle with borderline findings, high risk, bilateral: Secondary | ICD-10-CM | POA: Diagnosis not present

## 2017-08-10 DIAGNOSIS — H40053 Ocular hypertension, bilateral: Secondary | ICD-10-CM | POA: Diagnosis not present

## 2017-08-10 DIAGNOSIS — H353131 Nonexudative age-related macular degeneration, bilateral, early dry stage: Secondary | ICD-10-CM | POA: Diagnosis not present

## 2017-08-10 DIAGNOSIS — H0100B Unspecified blepharitis left eye, upper and lower eyelids: Secondary | ICD-10-CM | POA: Diagnosis not present

## 2017-08-10 DIAGNOSIS — H18413 Arcus senilis, bilateral: Secondary | ICD-10-CM | POA: Diagnosis not present

## 2017-08-10 DIAGNOSIS — H11153 Pinguecula, bilateral: Secondary | ICD-10-CM | POA: Diagnosis not present

## 2017-08-10 DIAGNOSIS — H11823 Conjunctivochalasis, bilateral: Secondary | ICD-10-CM | POA: Diagnosis not present

## 2017-08-10 DIAGNOSIS — H0100A Unspecified blepharitis right eye, upper and lower eyelids: Secondary | ICD-10-CM | POA: Diagnosis not present

## 2017-10-01 DIAGNOSIS — E039 Hypothyroidism, unspecified: Secondary | ICD-10-CM | POA: Diagnosis not present

## 2017-10-07 DIAGNOSIS — E039 Hypothyroidism, unspecified: Secondary | ICD-10-CM | POA: Diagnosis not present

## 2017-10-19 DIAGNOSIS — H40013 Open angle with borderline findings, low risk, bilateral: Secondary | ICD-10-CM | POA: Diagnosis not present

## 2017-10-19 DIAGNOSIS — H25813 Combined forms of age-related cataract, bilateral: Secondary | ICD-10-CM | POA: Diagnosis not present

## 2017-12-23 DIAGNOSIS — E039 Hypothyroidism, unspecified: Secondary | ICD-10-CM | POA: Diagnosis not present

## 2017-12-23 DIAGNOSIS — Z79899 Other long term (current) drug therapy: Secondary | ICD-10-CM | POA: Diagnosis not present

## 2017-12-30 DIAGNOSIS — E039 Hypothyroidism, unspecified: Secondary | ICD-10-CM | POA: Diagnosis not present

## 2017-12-30 DIAGNOSIS — Z23 Encounter for immunization: Secondary | ICD-10-CM | POA: Diagnosis not present

## 2018-01-10 DIAGNOSIS — H40013 Open angle with borderline findings, low risk, bilateral: Secondary | ICD-10-CM | POA: Diagnosis not present

## 2018-01-10 DIAGNOSIS — H25813 Combined forms of age-related cataract, bilateral: Secondary | ICD-10-CM | POA: Diagnosis not present

## 2018-02-01 ENCOUNTER — Other Ambulatory Visit: Payer: Self-pay | Admitting: *Deleted

## 2018-02-03 MED ORDER — PANTOPRAZOLE SODIUM 20 MG PO TBEC: 20.0000 mg | DELAYED_RELEASE_TABLET | Freq: Every day | ORAL | 3 refills | Status: DC

## 2018-02-14 ENCOUNTER — Other Ambulatory Visit: Payer: Self-pay

## 2018-02-14 MED ORDER — PANTOPRAZOLE SODIUM 20 MG PO TBEC: 20.0000 mg | DELAYED_RELEASE_TABLET | Freq: Every day | ORAL | 3 refills | Status: DC

## 2018-03-25 DIAGNOSIS — E039 Hypothyroidism, unspecified: Secondary | ICD-10-CM | POA: Diagnosis not present

## 2018-03-25 DIAGNOSIS — Z79899 Other long term (current) drug therapy: Secondary | ICD-10-CM | POA: Diagnosis not present

## 2018-04-04 DIAGNOSIS — E785 Hyperlipidemia, unspecified: Secondary | ICD-10-CM | POA: Diagnosis not present

## 2018-04-04 DIAGNOSIS — I10 Essential (primary) hypertension: Secondary | ICD-10-CM | POA: Diagnosis not present

## 2018-04-04 DIAGNOSIS — Z0001 Encounter for general adult medical examination with abnormal findings: Secondary | ICD-10-CM | POA: Diagnosis not present

## 2018-04-04 DIAGNOSIS — G309 Alzheimer's disease, unspecified: Secondary | ICD-10-CM | POA: Diagnosis not present

## 2018-04-04 DIAGNOSIS — E039 Hypothyroidism, unspecified: Secondary | ICD-10-CM | POA: Diagnosis not present

## 2018-04-08 DIAGNOSIS — G309 Alzheimer's disease, unspecified: Secondary | ICD-10-CM | POA: Diagnosis not present

## 2018-04-08 DIAGNOSIS — E039 Hypothyroidism, unspecified: Secondary | ICD-10-CM | POA: Diagnosis not present

## 2018-04-08 DIAGNOSIS — E785 Hyperlipidemia, unspecified: Secondary | ICD-10-CM | POA: Diagnosis not present

## 2018-08-01 DIAGNOSIS — G309 Alzheimer's disease, unspecified: Secondary | ICD-10-CM | POA: Diagnosis not present

## 2018-08-01 DIAGNOSIS — E785 Hyperlipidemia, unspecified: Secondary | ICD-10-CM | POA: Diagnosis not present

## 2018-12-27 DIAGNOSIS — Z23 Encounter for immunization: Secondary | ICD-10-CM | POA: Diagnosis not present

## 2019-01-03 DIAGNOSIS — H25813 Combined forms of age-related cataract, bilateral: Secondary | ICD-10-CM | POA: Diagnosis not present

## 2019-01-03 DIAGNOSIS — H52201 Unspecified astigmatism, right eye: Secondary | ICD-10-CM | POA: Diagnosis not present

## 2019-01-03 DIAGNOSIS — H5203 Hypermetropia, bilateral: Secondary | ICD-10-CM | POA: Diagnosis not present

## 2019-01-03 DIAGNOSIS — H40013 Open angle with borderline findings, low risk, bilateral: Secondary | ICD-10-CM | POA: Diagnosis not present

## 2019-01-03 DIAGNOSIS — H524 Presbyopia: Secondary | ICD-10-CM | POA: Diagnosis not present

## 2019-01-04 DIAGNOSIS — L570 Actinic keratosis: Secondary | ICD-10-CM | POA: Diagnosis not present

## 2019-01-04 DIAGNOSIS — L821 Other seborrheic keratosis: Secondary | ICD-10-CM | POA: Diagnosis not present

## 2019-01-04 DIAGNOSIS — D2272 Melanocytic nevi of left lower limb, including hip: Secondary | ICD-10-CM | POA: Diagnosis not present

## 2019-01-04 DIAGNOSIS — L814 Other melanin hyperpigmentation: Secondary | ICD-10-CM | POA: Diagnosis not present

## 2019-01-04 DIAGNOSIS — D225 Melanocytic nevi of trunk: Secondary | ICD-10-CM | POA: Diagnosis not present

## 2019-01-17 DIAGNOSIS — G309 Alzheimer's disease, unspecified: Secondary | ICD-10-CM | POA: Diagnosis not present

## 2019-03-01 ENCOUNTER — Encounter: Payer: Self-pay | Admitting: *Deleted

## 2019-03-06 ENCOUNTER — Telehealth: Payer: Self-pay | Admitting: *Deleted

## 2019-03-06 ENCOUNTER — Ambulatory Visit: Payer: Self-pay | Admitting: Diagnostic Neuroimaging

## 2019-03-06 NOTE — Telephone Encounter (Signed)
Spoke with wife and informed her that MD is out of office today, need to reschedule patient. I advised he has openings tomorrow and Wed. She stated she has MD appointment tomorrow at 8 am in [place]. So we rescheduled for 10 am tomorrow. I advised they arrive 30 minutes early. She will accompany patient who has memory issues, Wife verbalized understanding, appreciation.

## 2019-03-07 ENCOUNTER — Ambulatory Visit: Payer: Medicare Other | Admitting: Diagnostic Neuroimaging

## 2019-03-07 ENCOUNTER — Encounter: Payer: Self-pay | Admitting: Diagnostic Neuroimaging

## 2019-03-07 ENCOUNTER — Other Ambulatory Visit: Payer: Self-pay

## 2019-03-07 VITALS — BP 121/73 | HR 63 | Temp 97.8°F | Ht 75.0 in | Wt 237.0 lb

## 2019-03-07 DIAGNOSIS — F0390 Unspecified dementia without behavioral disturbance: Secondary | ICD-10-CM

## 2019-03-07 DIAGNOSIS — F03B0 Unspecified dementia, moderate, without behavioral disturbance, psychotic disturbance, mood disturbance, and anxiety: Secondary | ICD-10-CM

## 2019-03-07 MED ORDER — MEMANTINE HCL 10 MG PO TABS: 10.0000 mg | ORAL_TABLET | Freq: Two times a day (BID) | ORAL | 12 refills | Status: AC

## 2019-03-07 NOTE — Patient Instructions (Signed)
-   start memantine 10mg  at bedtime; increase to twice a day after 1-2 weeks  - continue donepezil 10mg  daily  - safety / supervision issues reviewed  - caregiver resources provided  - no driving

## 2019-03-07 NOTE — Progress Notes (Signed)
[facility]  PATIENT: Javier Morgan DOB: Jun 28, 1945  REFERRING CLINICIAN: Asencion Noble, MD HISTORY FROM: patient and wife REASON FOR VISIT: new consult   HISTORICAL  CHIEF COMPLAINT:  Chief Complaint  Patient presents with  . Memory Loss    rm 6 New Pt wifeInez Catalina MMSE 11    HISTORY OF PRESENT ILLNESS:   73 year old male here for evaluation of dementia.  Patient has gradual onset progressive short-term memory loss, confusion, difficulty with ADLs especially outside of home starting in 2014 and progressing over time.  Now patient having trouble with driving, using the remote control, remembering recent events and conversations.  He is not able to cook at home without assistance.  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  Patient has been on Aricept for 5 or 6 years.  Patient's wife is primary caregiver at home and they have 2 children; son lives 40 minutes away and daughter lives in [place].      REVIEW OF SYSTEMS: Full 14 system review of systems performed and negative with exception of: As per HPI.  ALLERGIES: No Known Allergies  HOME MEDICATIONS: Outpatient Medications Prior to Visit  Medication Sig Dispense Refill  . amLODipine (NORVASC) 10 MG tablet Take 10 mg by mouth daily.    Marland Kitchen atorvastatin (LIPITOR) 80 MG tablet Take 80 mg by mouth at bedtime.    . donepezil (ARICEPT) 10 MG tablet Take 10 mg by mouth at bedtime.    Marland Kitchen escitalopram (LEXAPRO) 10 MG tablet Take 10 mg by mouth at bedtime.     Marland Kitchen levothyroxine (SYNTHROID, LEVOTHROID) 175 MCG tablet Take 175 mcg by mouth at bedtime.    Marland Kitchen lisinopril (PRINIVIL,ZESTRIL) 40 MG tablet Take 40 mg by mouth daily.    . vitamin B-12 (CYANOCOBALAMIN) 1000 MCG tablet Take 1,000 mcg by mouth daily.    Marland Kitchen levothyroxine (SYNTHROID, LEVOTHROID) 200 MCG tablet Take 1 tablet by mouth daily.  4  . pantoprazole (PROTONIX) 20 MG tablet Take 1 tablet (20 mg total) by  mouth daily. 90 tablet 3  . polyethylene glycol-electrolytes (TRILYTE) 420 g solution Take 4,000 mLs by mouth as directed. (Patient not taking: Reported on 05/28/2017) 4000 mL 0   No facility-administered medications prior to visit.    PAST MEDICAL HISTORY: Past Medical History:  Diagnosis Date  . Dementia ([place])   . Depression   . GERD without esophagitis   . Hypercholesteremia   . Hypertension   . Hypothyroidism   . Osteoarthritis     PAST SURGICAL HISTORY: Past Surgical History:  Procedure Laterality Date  . COLONOSCOPY  2006   records not available at time of appt   . COLONOSCOPY N/A 04/16/2016   Procedure: COLONOSCOPY;  Surgeon: Daneil Dolin, MD;  [facility]: [facility];  Service: Endoscopy;  Laterality: N/A;  1:00 pm  . ESOPHAGOGASTRODUODENOSCOPY     remote past, with dilation   . ESOPHAGOGASTRODUODENOSCOPY N/A 04/16/2016   Procedure: ESOPHAGOGASTRODUODENOSCOPY (EGD);  Surgeon: Daneil Dolin, MD;  Location: [facility];  Service: Endoscopy;  Laterality: N/A;  . EXCISIONAL HEMORRHOIDECTOMY    . MALONEY DILATION N/A 04/16/2016   Procedure: Venia Minks DILATION;  Surgeon: Daneil Dolin, MD;  Location: [facility];  Service: Endoscopy;  Laterality: N/A;  . TONSILLECTOMY      FAMILY HISTORY: Family History  Problem Relation Age of Onset  . Dementia Mother   . Alzheimer's disease Mother   . Emphysema Father   .  COPD Father   . Emphysema Brother   . Alzheimer's disease Maternal Aunt   . Alzheimer's disease Maternal Uncle   . Alzheimer's disease Paternal Aunt   . Alzheimer's disease Maternal Grandmother   . Heart disease Maternal Grandfather   . Alzheimer's disease Other   . Colon cancer Neg Hx     SOCIAL HISTORY: Social History   Socioeconomic History  . Marital status: Married    Spouse name: [name]  . Number of children: 2  . Years of education: Not on file  . Highest education level: Some college, no degree  Occupational History    Comment: retired    Tobacco Use  . Smoking status: Former Smoker    Quit date: 03/07/1975    Years since quitting: 44.0  . Smokeless tobacco: Never Used  . Tobacco comment: quit about 30-45 years ago   Substance and Sexual Activity  . Alcohol use: No  . Drug use: No  . Sexual activity: Not on file  Other Topics Concern  . Not on file  Social History Narrative   03/01/19 lives with wife/caregiver   Caffeine- 2 coffees daily   Social Determinants of Health   Financial Resource Strain:   . Difficulty of Paying Living Expenses: Not on file  Food Insecurity:   . Worried About Charity fundraiser in the Last Year: Not on file  . Ran Out of Food in the Last Year: Not on file  Transportation Needs:   . Lack of Transportation (Medical): Not on file  . Lack of Transportation (Non-Medical): Not on file  Physical Activity:   . Days of Exercise per Week: Not on file  . Minutes of Exercise per Session: Not on file  Stress:   . Feeling of Stress : Not on file  Social Connections:   . Frequency of Communication with Friends and Family: Not on file  . Frequency of Social Gatherings with Friends and Family: Not on file  . Attends Religious Services: Not on file  . Active Member of Clubs or Organizations: Not on file  . Attends Archivist Meetings: Not on file  . Marital Status: Not on file  Intimate Partner Violence:   . Fear of Current or Ex-Partner: Not on file  . Emotionally Abused: Not on file  . Physically Abused: Not on file  . Sexually Abused: Not on file     PHYSICAL EXAM  GENERAL EXAM/CONSTITUTIONAL: Vitals:  Vitals:   03/07/19 0933  BP: 121/73  Pulse: 63  Temp: 97.8 F (36.6 C)  Weight: 237 lb (107.5 kg)  Height: 6\' 3"  (1.905 m)     Body mass index is 29.62 kg/m. Wt Readings from Last 3 Encounters:  03/07/19 237 lb (107.5 kg)  05/28/17 257 lb 12.8 oz (116.9 kg)  03/26/16 252 lb (114.3 kg)     Patient is in no distress; well developed, nourished and groomed; neck  is supple  CARDIOVASCULAR:  Examination of carotid arteries is normal; no carotid bruits  Regular rate and rhythm, no murmurs  Examination of peripheral vascular system by observation and palpation is normal  EYES:  Ophthalmoscopic exam of optic discs and posterior segments is normal; no papilledema or hemorrhages  No exam data present  MUSCULOSKELETAL:  Gait, strength, tone, movements noted in Neurologic exam below  NEUROLOGIC: MENTAL STATUS:  MMSE - [place] Exam 03/07/2019  Orientation to time 1  Orientation to Place 2  Registration 3  Attention/ Calculation 0  Recall 0  Language- name 2 objects 1  Language- repeat 0  Language- follow 3 step command 2  Language- follow 3 step command-comments didn't fold paper  Language- read & follow direction 1  Write a sentence 1  Copy design 0  Total score 11    awake, alert, oriented to person  DECR MEMORY   DECRattention and concentration  DECR FLUENCY; comprehension intact, naming intact  fund of knowledge appropriate  CRANIAL NERVE:   2nd - no papilledema on fundoscopic exam  2nd, 3rd, 4th, 6th - pupils equal and reactive to light, visual fields full to confrontation, extraocular muscles intact, no nystagmus  5th - facial sensation symmetric  7th - facial strength symmetric  8th - hearing intact  9th - palate elevates symmetrically, uvula midline  11th - shoulder shrug symmetric  12th - tongue protrusion midline  MOTOR:   normal bulk and tone, full strength in the BUE, BLE  SENSORY:   normal and symmetric to light touch  COORDINATION:   finger-nose-finger, fine finger movements normal  REFLEXES:   deep tendon reflexes present and symmetric  GAIT/STATION:   narrow based gait     DIAGNOSTIC DATA (LABS, IMAGING, TESTING) - I reviewed patient records, labs, notes, testing and imaging myself where available.  Lab Results  Component Value Date   WBC 11.8 (H) 11/19/2014   HGB  14.2 11/19/2014   HCT 41.3 11/19/2014   MCV 89.6 11/19/2014   PLT 214 11/19/2014      Component Value Date/Time   NA 142 11/19/2014 0323   K 3.2 (L) 11/19/2014 0323   CL 107 11/19/2014 0323   CO2 27 11/19/2014 0323   GLUCOSE 168 (H) 11/19/2014 0323   BUN 14 11/19/2014 0323   CREATININE 0.81 11/19/2014 0323   CALCIUM 8.8 (L) 11/19/2014 0323   PROT 6.4 (L) 11/19/2014 0323   ALBUMIN 4.0 11/19/2014 0323   AST 21 11/19/2014 0323   ALT 16 (L) 11/19/2014 0323   ALKPHOS 81 11/19/2014 0323   BILITOT 1.0 11/19/2014 0323   GFRNONAA >60 11/19/2014 0323   GFRAA >60 11/19/2014 0323   No results found for: CHOL, HDL, LDLCALC, LDLDIRECT, TRIG, CHOLHDL No results found for: HGBA1C No results found for: VITAMINB12 No results found for: TSH   08/24/14 MRI brain [I reviewed images myself and agree with interpretation. -VRP]   - No acute or reversible finding. Chronic small-vessel ischemic changes of the cerebral hemispheric white matter, most notable in the periatrial white matter.     ASSESSMENT AND PLAN  73 y.o. year old male here with gradual onset progressive short-term memory loss and confusion, decline in ADLs, consistent with neurodegenerative dementia.  MMSE 11 out of 30.  Patient able to take care of his personal hygiene, feeding bathing and dressing, but needs assistance to function outside of the home.  Dx:  1. Moderate dementia without behavioral disturbance (HCC)     PLAN:  - start memantine 10mg  at bedtime; increase to twice a day after 1-2 weeks - continue donepezil 10mg  daily - safety / supervision issues reviewed - caregiver resources provided - no driving  Meds ordered this encounter  Medications  . memantine (NAMENDA) 10 MG tablet    Sig: Take 1 tablet (10 mg total) by mouth 2 (two) times daily.    Dispense:  60 tablet    Refill:  12   Return for return to PCP.    Penni Bombard, MD [id number], [id number] AM Certified in Neurology, Neurophysiology and  Neuroimaging  [facility] [address] [place], [place] [address] [phone]

## 2019-03-22 ENCOUNTER — Telehealth: Payer: Self-pay | Admitting: Internal Medicine

## 2019-03-22 ENCOUNTER — Other Ambulatory Visit: Payer: Self-pay | Admitting: Gastroenterology

## 2019-03-22 NOTE — Telephone Encounter (Signed)
Noted routing to [facility] [facility] refill box. Pt's last ov was 05/28/17.

## 2019-03-22 NOTE — Telephone Encounter (Signed)
Pt's wife called saying patient needs a 90 day supply of Protonix 20 mg sent to [facility]. [phone]

## 2019-03-22 NOTE — Telephone Encounter (Signed)
Can we verify dose? Last visit he was taking Protonix 40 mg every other day.

## 2019-03-23 ENCOUNTER — Other Ambulatory Visit: Payer: Self-pay | Admitting: Gastroenterology

## 2019-03-23 MED ORDER — PANTOPRAZOLE SODIUM 20 MG PO TBEC: 20.0000 mg | DELAYED_RELEASE_TABLET | Freq: Every day | ORAL | 1 refills | Status: DC

## 2019-03-23 MED ORDER — PANTOPRAZOLE SODIUM 20 MG PO TBEC: 20.0000 mg | DELAYED_RELEASE_TABLET | Freq: Every day | ORAL | 5 refills | Status: DC

## 2019-03-23 NOTE — Telephone Encounter (Signed)
Rx sent 

## 2019-03-23 NOTE — Telephone Encounter (Signed)
Noted. Spouse is aware.  

## 2019-03-23 NOTE — Telephone Encounter (Signed)
Lmom, waiting on a return call.  

## 2019-03-23 NOTE — Telephone Encounter (Signed)
Spoke with pts spouse. She read the bottle and it says Pantoprazole 20 mg daily written by EG. Pt is doing well with the 20 mg daily.

## 2019-04-19 DIAGNOSIS — I10 Essential (primary) hypertension: Secondary | ICD-10-CM | POA: Diagnosis not present

## 2019-04-19 DIAGNOSIS — K219 Gastro-esophageal reflux disease without esophagitis: Secondary | ICD-10-CM | POA: Diagnosis not present

## 2019-04-19 DIAGNOSIS — E785 Hyperlipidemia, unspecified: Secondary | ICD-10-CM | POA: Diagnosis not present

## 2019-04-19 DIAGNOSIS — E039 Hypothyroidism, unspecified: Secondary | ICD-10-CM | POA: Diagnosis not present

## 2019-04-19 DIAGNOSIS — G309 Alzheimer's disease, unspecified: Secondary | ICD-10-CM | POA: Diagnosis not present

## 2019-04-20 DIAGNOSIS — E785 Hyperlipidemia, unspecified: Secondary | ICD-10-CM | POA: Diagnosis not present

## 2019-04-20 DIAGNOSIS — I10 Essential (primary) hypertension: Secondary | ICD-10-CM | POA: Diagnosis not present

## 2019-04-20 DIAGNOSIS — E039 Hypothyroidism, unspecified: Secondary | ICD-10-CM | POA: Diagnosis not present

## 2019-08-21 DIAGNOSIS — G309 Alzheimer's disease, unspecified: Secondary | ICD-10-CM | POA: Diagnosis not present

## 2019-08-21 DIAGNOSIS — I10 Essential (primary) hypertension: Secondary | ICD-10-CM | POA: Diagnosis not present

## 2019-10-20 ENCOUNTER — Telehealth: Payer: Self-pay | Admitting: *Deleted

## 2019-10-20 MED ORDER — PANTOPRAZOLE SODIUM 20 MG PO TBEC: 20.0000 mg | DELAYED_RELEASE_TABLET | Freq: Every day | ORAL | 0 refills | Status: DC

## 2019-10-20 NOTE — Telephone Encounter (Signed)
Please let pt know that I sent in one 3 month supply. He will need OV for further refills because we have not seen in since 05/2017.

## 2019-10-20 NOTE — Telephone Encounter (Signed)
Pt would like [place] to refill Pantoprazole.  Pt said [facility] told them to call us.  Would like 3 mo supply.

## 2019-10-20 NOTE — Telephone Encounter (Signed)
Lmom for a call back.  

## 2019-10-20 NOTE — Addendum Note (Signed)
Addended by: Mahala Menghini on: 10/20/2019 08:51 AM   Modules accepted: Orders

## 2019-10-23 NOTE — Telephone Encounter (Addendum)
Wife (listed on DPR) informed that [facility] sent in one 3 month supply. Informed her that he will need OV for further refills because we have not seen in since 05/2017.  Scheduled ov for 12/26/19 at 10:30 with [facility].

## 2019-12-14 ENCOUNTER — Other Ambulatory Visit: Payer: Self-pay | Admitting: Gastroenterology

## 2019-12-21 DIAGNOSIS — I10 Essential (primary) hypertension: Secondary | ICD-10-CM | POA: Diagnosis not present

## 2019-12-21 DIAGNOSIS — G309 Alzheimer's disease, unspecified: Secondary | ICD-10-CM | POA: Diagnosis not present

## 2019-12-26 ENCOUNTER — Encounter: Payer: Self-pay | Admitting: Gastroenterology

## 2019-12-26 ENCOUNTER — Ambulatory Visit: Payer: Medicare Other | Admitting: Gastroenterology

## 2019-12-26 ENCOUNTER — Other Ambulatory Visit: Payer: Self-pay

## 2019-12-26 DIAGNOSIS — K219 Gastro-esophageal reflux disease without esophagitis: Secondary | ICD-10-CM | POA: Insufficient documentation

## 2019-12-26 DIAGNOSIS — K2100 Gastro-esophageal reflux disease with esophagitis, without bleeding: Secondary | ICD-10-CM | POA: Diagnosis not present

## 2019-12-26 MED ORDER — PANTOPRAZOLE SODIUM 20 MG PO TBEC: DELAYED_RELEASE_TABLET | ORAL | 3 refills | Status: DC

## 2019-12-26 NOTE — Patient Instructions (Addendum)
1. Continue pantoprazole 20mg  daily before breakfast as needed. You can try backing down to every other day to see if you tolerate the lower dose. If you continue to have well controlled heartburn you can try coming off medication and just take when needed. Rule of thumb, if you have reflux 3-4 times per week, then stay on daily pantoprazole. 2. We will see you back in two years. Call sooner if any questions or concerns.

## 2019-12-26 NOTE — Progress Notes (Signed)
      Primary Care Physician: Asencion Noble, MD  Primary Gastroenterologist:  Garfield Cornea, MD   Chief Complaint  Patient presents with  . Gastroesophageal Reflux    doing well    HPI: Javier Morgan is a 74 y.o. male here for follow-up of erosive reflux esophagitis.  Last seen in March 2019.  Up-to-date on colonoscopy with no future plans for colonoscopy for screening given age. Asked to come in for refills.   Doing well with heartburn control. Takes pantoprazole 20mg  daily. Has not tried to come off medication. No dysphagia. No vomiting. Appetite is good. No abdominal pain. No constipation, diarrhea, melena, brbpr.   Current Outpatient Medications  Medication Sig Dispense Refill  . amLODipine (NORVASC) 10 MG tablet Take 10 mg by mouth daily.    Marland Kitchen atorvastatin (LIPITOR) 80 MG tablet Take 80 mg by mouth at bedtime.    . donepezil (ARICEPT) 10 MG tablet Take 10 mg by mouth at bedtime.    Marland Kitchen escitalopram (LEXAPRO) 10 MG tablet Take 10 mg by mouth at bedtime.     Marland Kitchen levothyroxine (SYNTHROID, LEVOTHROID) 175 MCG tablet Take 175 mcg by mouth at bedtime.    Marland Kitchen lisinopril (PRINIVIL,ZESTRIL) 40 MG tablet Take 40 mg by mouth daily.    . memantine (NAMENDA) 10 MG tablet Take 1 tablet (10 mg total) by mouth 2 (two) times daily. 60 tablet 12  . pantoprazole (PROTONIX) 20 MG tablet TAKE 1 TABLET BY MOUTH  DAILY BEFORE BREAKFAST 90 tablet 0  . vitamin B-12 (CYANOCOBALAMIN) 1000 MCG tablet Take 1,000 mcg by mouth daily.     No current facility-administered medications for this visit.    Allergies as of 12/26/2019  . (No Known Allergies)    ROS:  General: Negative for anorexia, weight loss, fever, chills, fatigue, weakness. ENT: Negative for hoarseness, difficulty swallowing , nasal congestion. CV: Negative for chest pain, angina, palpitations, dyspnea on exertion, peripheral edema.  Respiratory: Negative for dyspnea at rest, dyspnea on exertion, cough, sputum, wheezing.  GI: See history of  present illness. GU:  Negative for dysuria, hematuria, urinary incontinence, urinary frequency, nocturnal urination.  Endo: Negative for unusual weight change.    Physical Examination:   BP 139/74   Pulse 63   Temp 97.6 F (36.4 C) (Oral)   Ht 6\' 3"  (1.905 m)   Wt 251 lb 6.4 oz (114 kg)   BMI 31.42 kg/m   General: Well-nourished, well-developed in no acute distress. Accompanied by wife Eyes: No icterus. Mouth: masked   Abdomen: Bowel sounds are normal, nontender, nondistended, no hepatosplenomegaly or masses, no abdominal bruits or hernia , no rebound or guarding.   Extremities: No lower extremity edema. No clubbing or deformities. Neuro: Alert and oriented x 4   Skin: Warm and dry, no jaundice.   Psych: Alert and cooperative, normal mood and affect.   Impression/plan:  74 year old male with history of aggressive reflux esophagitis presents for follow-up. He is taking pantoprazole 20 mg daily. Really has not decreased to every other day dosing as previously discussed. Follow antireflux measures. We will try to decrease to every other day pantoprazole to see how he does. If continues to do well, he can try stopping medication however if recurrent heartburn 3 to 4 days a week, would advise staying on pantoprazole. He will call with any questions or concerns. Return to the office in two years or call sooner if needed.

## 2019-12-29 DIAGNOSIS — Z23 Encounter for immunization: Secondary | ICD-10-CM | POA: Diagnosis not present

## 2020-01-04 DIAGNOSIS — H40013 Open angle with borderline findings, low risk, bilateral: Secondary | ICD-10-CM | POA: Diagnosis not present

## 2020-01-04 DIAGNOSIS — H25813 Combined forms of age-related cataract, bilateral: Secondary | ICD-10-CM | POA: Diagnosis not present

## 2020-01-11 ENCOUNTER — Ambulatory Visit: Payer: Medicare Other | Attending: Internal Medicine

## 2020-01-11 DIAGNOSIS — Z23 Encounter for immunization: Secondary | ICD-10-CM

## 2020-01-11 NOTE — Progress Notes (Signed)
   Covid-19 Vaccination Clinic  Name:  Javier Morgan    MRN: [id number] DOB: Oct 10, 1945  01/11/2020  Javier Morgan was observed post Covid-19 immunization for 15 minutes without incident. He was provided with Vaccine Information Sheet and instruction to access the V-Safe system.   Javier Morgan was instructed to call 911 with any severe reactions post vaccine: Marland Kitchen Difficulty breathing  . Swelling of face and throat  . A fast heartbeat  . A bad rash all over body  . Dizziness and weakness

## 2020-02-27 DIAGNOSIS — H40013 Open angle with borderline findings, low risk, bilateral: Secondary | ICD-10-CM | POA: Diagnosis not present

## 2020-04-16 DIAGNOSIS — I10 Essential (primary) hypertension: Secondary | ICD-10-CM | POA: Diagnosis not present

## 2020-04-16 DIAGNOSIS — E039 Hypothyroidism, unspecified: Secondary | ICD-10-CM | POA: Diagnosis not present

## 2020-04-16 DIAGNOSIS — G309 Alzheimer's disease, unspecified: Secondary | ICD-10-CM | POA: Diagnosis not present

## 2020-04-16 DIAGNOSIS — E785 Hyperlipidemia, unspecified: Secondary | ICD-10-CM | POA: Diagnosis not present

## 2020-04-23 DIAGNOSIS — R7309 Other abnormal glucose: Secondary | ICD-10-CM | POA: Diagnosis not present

## 2020-04-23 DIAGNOSIS — E039 Hypothyroidism, unspecified: Secondary | ICD-10-CM | POA: Diagnosis not present

## 2020-04-23 DIAGNOSIS — Z0000 Encounter for general adult medical examination without abnormal findings: Secondary | ICD-10-CM | POA: Diagnosis not present

## 2020-04-23 DIAGNOSIS — G309 Alzheimer's disease, unspecified: Secondary | ICD-10-CM | POA: Diagnosis not present

## 2020-04-23 DIAGNOSIS — E785 Hyperlipidemia, unspecified: Secondary | ICD-10-CM | POA: Diagnosis not present

## 2020-04-23 DIAGNOSIS — I10 Essential (primary) hypertension: Secondary | ICD-10-CM | POA: Diagnosis not present

## 2020-05-02 ENCOUNTER — Ambulatory Visit: Payer: Medicare Other | Admitting: Podiatry

## 2020-05-02 ENCOUNTER — Other Ambulatory Visit: Payer: Self-pay

## 2020-05-02 DIAGNOSIS — M2041 Other hammer toe(s) (acquired), right foot: Secondary | ICD-10-CM

## 2020-05-02 DIAGNOSIS — R52 Pain, unspecified: Secondary | ICD-10-CM | POA: Diagnosis not present

## 2020-05-02 DIAGNOSIS — M2012 Hallux valgus (acquired), left foot: Secondary | ICD-10-CM

## 2020-05-02 DIAGNOSIS — L84 Corns and callosities: Secondary | ICD-10-CM

## 2020-05-02 DIAGNOSIS — M21611 Bunion of right foot: Secondary | ICD-10-CM | POA: Diagnosis not present

## 2020-05-02 DIAGNOSIS — B351 Tinea unguium: Secondary | ICD-10-CM

## 2020-05-02 DIAGNOSIS — M2042 Other hammer toe(s) (acquired), left foot: Secondary | ICD-10-CM

## 2020-05-02 DIAGNOSIS — M21612 Bunion of left foot: Secondary | ICD-10-CM

## 2020-05-02 DIAGNOSIS — M79675 Pain in left toe(s): Secondary | ICD-10-CM

## 2020-05-02 DIAGNOSIS — M2011 Hallux valgus (acquired), right foot: Secondary | ICD-10-CM | POA: Diagnosis not present

## 2020-05-02 DIAGNOSIS — M79674 Pain in right toe(s): Secondary | ICD-10-CM | POA: Diagnosis not present

## 2020-05-02 NOTE — Patient Instructions (Signed)
More silicone pads can be purchased from:  [email or web address]  

## 2020-05-05 ENCOUNTER — Encounter: Payer: Self-pay | Admitting: Podiatry

## 2020-05-05 NOTE — Progress Notes (Signed)
  Subjective:  Patient ID: Javier Morgan, male    DOB: Nov 21, 1945,  MRN: [id number]  Chief Complaint  Patient presents with  . Nail Problem    B/l- PCP has referred pt due to having nail problems- thick long discoloration- also LT 2nd toe- has big lesion on toe- further evaluation     75 y.o. male presents with the above complaint. History confirmed with patient.  Here with his wife today.  Lesion is fairly painful.  Denies drainage.  The toes are thickened elongated and is not able to cut them  Objective:  Physical Exam: warm, good capillary refill, no trophic changes or ulcerative lesions, normal DP and PT pulses and normal sensory exam.  Left second toe is a rigid hammertoe with dorsal PIPJ corn.  All toes are rigid hammertoes.  Onychomycosis x10 with yellow discolored thickened toenail plates with subungual debris. Assessment:   1. Onychomycosis   2. Pain due to onychomycosis of toenails of both feet   3. Callus of foot   4. Pain   5. Hammertoe of left foot   6. Hammertoe of right foot   7. Hallux valgus with bunions, left   8. Hallux valgus with bunions, right      Plan:  Patient was evaluated and treated and all questions answered.  Discussed the etiology and treatment options for the condition in detail with the patient. Educated patient on the topical and oral treatment options for mycotic nails. Recommended debridement of the nails today. Sharp and mechanical debridement performed of all painful and mycotic nails today. Nails debrided in length and thickness using a nail nipper to level of comfort. Discussed treatment options including appropriate shoe gear. Follow up as needed for painful nails.   All symptomatic hyperkeratoses were safely debrided with a sterile #15 blade to patient's level of comfort without incident. We discussed preventative and palliative care of these lesions including supportive and accommodative shoegear, padding, prefabricated and custom molded  accommodative orthoses, use of a pumice stone and lotions/creams daily.  Silicone toe crest dispensed  Return in about 3 months (around 07/30/2020).

## 2020-05-13 DIAGNOSIS — I10 Essential (primary) hypertension: Secondary | ICD-10-CM | POA: Diagnosis not present

## 2020-05-13 DIAGNOSIS — K219 Gastro-esophageal reflux disease without esophagitis: Secondary | ICD-10-CM | POA: Diagnosis not present

## 2020-05-13 DIAGNOSIS — E785 Hyperlipidemia, unspecified: Secondary | ICD-10-CM | POA: Diagnosis not present

## 2020-05-13 DIAGNOSIS — E039 Hypothyroidism, unspecified: Secondary | ICD-10-CM | POA: Diagnosis not present

## 2020-05-20 DIAGNOSIS — Z23 Encounter for immunization: Secondary | ICD-10-CM | POA: Diagnosis not present

## 2020-06-13 DIAGNOSIS — E785 Hyperlipidemia, unspecified: Secondary | ICD-10-CM | POA: Diagnosis not present

## 2020-06-13 DIAGNOSIS — I10 Essential (primary) hypertension: Secondary | ICD-10-CM | POA: Diagnosis not present

## 2020-06-13 DIAGNOSIS — K219 Gastro-esophageal reflux disease without esophagitis: Secondary | ICD-10-CM | POA: Diagnosis not present

## 2020-06-13 DIAGNOSIS — E039 Hypothyroidism, unspecified: Secondary | ICD-10-CM | POA: Diagnosis not present

## 2020-06-26 DIAGNOSIS — H25813 Combined forms of age-related cataract, bilateral: Secondary | ICD-10-CM | POA: Diagnosis not present

## 2020-06-26 DIAGNOSIS — H40013 Open angle with borderline findings, low risk, bilateral: Secondary | ICD-10-CM | POA: Diagnosis not present

## 2020-07-04 ENCOUNTER — Other Ambulatory Visit: Payer: Self-pay | Admitting: Diagnostic Neuroimaging

## 2020-07-19 DIAGNOSIS — I10 Essential (primary) hypertension: Secondary | ICD-10-CM | POA: Diagnosis not present

## 2020-07-30 ENCOUNTER — Ambulatory Visit: Payer: Medicare Other | Admitting: Podiatry

## 2020-09-12 DIAGNOSIS — I10 Essential (primary) hypertension: Secondary | ICD-10-CM | POA: Diagnosis not present

## 2020-09-12 DIAGNOSIS — E039 Hypothyroidism, unspecified: Secondary | ICD-10-CM | POA: Diagnosis not present

## 2020-09-12 DIAGNOSIS — K219 Gastro-esophageal reflux disease without esophagitis: Secondary | ICD-10-CM | POA: Diagnosis not present

## 2020-09-12 DIAGNOSIS — E785 Hyperlipidemia, unspecified: Secondary | ICD-10-CM | POA: Diagnosis not present

## 2020-11-13 DIAGNOSIS — K219 Gastro-esophageal reflux disease without esophagitis: Secondary | ICD-10-CM | POA: Diagnosis not present

## 2020-11-13 DIAGNOSIS — E039 Hypothyroidism, unspecified: Secondary | ICD-10-CM | POA: Diagnosis not present

## 2020-11-13 DIAGNOSIS — I10 Essential (primary) hypertension: Secondary | ICD-10-CM | POA: Diagnosis not present

## 2020-11-13 DIAGNOSIS — E785 Hyperlipidemia, unspecified: Secondary | ICD-10-CM | POA: Diagnosis not present

## 2020-11-16 ENCOUNTER — Other Ambulatory Visit: Payer: Self-pay | Admitting: Gastroenterology

## 2020-12-26 DIAGNOSIS — Z23 Encounter for immunization: Secondary | ICD-10-CM | POA: Diagnosis not present

## 2021-01-28 ENCOUNTER — Ambulatory Visit: Payer: Medicare Other | Admitting: Diagnostic Neuroimaging

## 2021-01-28 ENCOUNTER — Encounter: Payer: Self-pay | Admitting: Diagnostic Neuroimaging

## 2021-01-28 VITALS — BP 138/77 | HR 64 | Wt 258.0 lb

## 2021-01-28 DIAGNOSIS — F03B11 Unspecified dementia, moderate, with agitation: Secondary | ICD-10-CM

## 2021-01-28 NOTE — Patient Instructions (Signed)
MODERATE DEMENTIA WITH BEHAVIOR CHANGES - continue memantine, donepezil, quetiapine - safety / supervision issues reviewed - caregiver resources provided; family looking into options for addl care (home aids vs memory care / SNF) - no driving

## 2021-01-28 NOTE — Progress Notes (Signed)
[facility]  PATIENT: Javier Morgan DOB: 25-Jul-1945  REFERRING CLINICIAN: Asencion Noble, MD HISTORY FROM: patient and daughter REASON FOR VISIT: follow up   [place]:  Chief Complaint  Patient presents with   Dementia    Rm 6, dgtrJudeen Hammans  MMSE 5    HISTORY OF PRESENT ILLNESS:   UPDATE (01/28/21, VRP): Since last visit, having more progression of memory loss, confusion, agitation and behavior changes.  Now on Namenda, donepezil, quetiapine.  Patient accompanied by daughter today.  Patient still living at home.  Having more problems with maintaining hygiene and taking a bath.  Family is looking at placement options, but are concerned about financial limitations.  PRIOR HPI: 75 year old male here for evaluation of dementia.  Patient has gradual onset progressive short-term memory loss, confusion, difficulty with ADLs especially outside of home starting in 2014 and progressing over time.  Now patient having trouble with driving, using the remote control, remembering recent events and conversations.  He is not able to cook at home without assistance.  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  Patient has been on Aricept for 5 or 6 years.  Patient's wife is primary caregiver at home and they have 2 children; son lives 34 minutes away and daughter lives in [place].    REVIEW OF SYSTEMS: Full 14 system review of systems performed and negative with exception of: As per HPI.  ALLERGIES: No Known Allergies  HOME MEDICATIONS: Outpatient Medications Prior to Visit  Medication Sig Dispense Refill   amLODipine (NORVASC) 10 MG tablet Take 10 mg by mouth daily.     atorvastatin (LIPITOR) 80 MG tablet Take 80 mg by mouth at bedtime.     donepezil (ARICEPT) 10 MG tablet Take 10 mg by mouth at bedtime.     escitalopram (LEXAPRO) 10 MG tablet Take 10 mg by mouth at bedtime.      latanoprost (XALATAN)  0.005 % ophthalmic solution 1 drop at bedtime.     levothyroxine (SYNTHROID, LEVOTHROID) 175 MCG tablet Take 175 mcg by mouth at bedtime.     lisinopril (PRINIVIL,ZESTRIL) 40 MG tablet Take 40 mg by mouth daily.     memantine (NAMENDA) 10 MG tablet Take 1 tablet (10 mg total) by mouth 2 (two) times daily. 60 tablet 12   pantoprazole (PROTONIX) 20 MG tablet TAKE 1 TABLET BY MOUTH  DAILY BEFORE BREAKFAST. 90 tablet 3   QUEtiapine (SEROQUEL) 25 MG tablet Take 25 mg by mouth daily.     vitamin B-12 (CYANOCOBALAMIN) 1000 MCG tablet Take 1,000 mcg by mouth daily.     VYZULTA 0.024 % SOLN Apply to eye.     No facility-administered medications prior to visit.    PAST MEDICAL HISTORY: Past Medical History:  Diagnosis Date   Dementia ([place])    Depression    GERD without esophagitis    Hypercholesteremia    Hypertension    Hypothyroidism    Osteoarthritis     PAST SURGICAL HISTORY: Past Surgical History:  Procedure Laterality Date   COLONOSCOPY  2006   records not available at time of appt    COLONOSCOPY N/A 04/16/2016   rourk: diverticulosis. no furture colonoscopies for screening due to age   ESOPHAGOGASTRODUODENOSCOPY     remote past, with dilation    ESOPHAGOGASTRODUODENOSCOPY N/A 04/16/2016   Rourk: LA Grade A esophagitis   EXCISIONAL HEMORRHOIDECTOMY     MALONEY DILATION N/A 04/16/2016   Procedure:  MALONEY DILATION;  Surgeon: Daneil Dolin, MD;  [facility];  Service: Endoscopy;  Laterality: N/A;   TONSILLECTOMY      FAMILY HISTORY: Family History  Problem Relation Age of Onset   Dementia Mother    Alzheimer's disease Mother    Emphysema Father    COPD Father    Emphysema Brother    Alzheimer's disease Maternal Aunt    Alzheimer's disease Maternal Uncle    Alzheimer's disease Paternal Aunt    Alzheimer's disease Maternal Grandmother    Heart disease Maternal Grandfather    Alzheimer's disease Other    Colon cancer Neg Hx     SOCIAL HISTORY: Social History    Socioeconomic History   Marital status: Married    Spouse name: [name]   Number of children: 2   Years of education: Not on file   Highest education level: Some college, no degree  Occupational History    Comment: retired  Tobacco Use   Smoking status: Former    Types: Cigarettes    Quit date: 03/07/1975    Years since quitting: 45.9   Smokeless tobacco: Never   Tobacco comments:    quit about 30-45 years ago   Substance and Sexual Activity   Alcohol use: No   Drug use: No   Sexual activity: Not on file  Other Topics Concern   Not on file  Social History Narrative   01/28/21  lives with wife   Goes to adult daycare 3-5 days a week   Caffeine- 2 coffees daily   Social Determinants of Health   Financial Resource Strain: Not on file  Food Insecurity: Not on file  Transportation Needs: Not on file  Physical Activity: Not on file  Stress: Not on file  Social Connections: Not on file  Intimate Partner Violence: Not on file     PHYSICAL EXAM  GENERAL EXAM/CONSTITUTIONAL: Vitals:  Vitals:   01/28/21 1112  BP: 138/77  Pulse: 64  Weight: 258 lb (117 kg)   Body mass index is 32.25 kg/m. Wt Readings from Last 3 Encounters:  01/28/21 258 lb (117 kg)  12/26/19 251 lb 6.4 oz (114 kg)  03/07/19 237 lb (107.5 kg)   Patient is in no distress; well developed, nourished and groomed; neck is supple  CARDIOVASCULAR: Examination of carotid arteries is normal; no carotid bruits Regular rate and rhythm, no murmurs Examination of peripheral vascular system by observation and palpation is normal  EYES: Ophthalmoscopic exam of optic discs and posterior segments is normal; no papilledema or hemorrhages No results found.  MUSCULOSKELETAL: Gait, strength, tone, movements noted in Neurologic exam below  NEUROLOGIC: MENTAL STATUS:  MMSE - Mini Mental State Exam 01/28/2021 03/07/2019  Orientation to time 0 1  Orientation to Place 0 2  Registration 3 3  Attention/  Calculation 0 0  Recall 0 0  Language- name 2 objects 1 1  Language- repeat 0 0  Language- follow 3 step command 1 2  Language- follow 3 step command-comments - didn't fold paper  Language- read & follow direction 0 1  Write a sentence 0 1  Copy design 0 0  Total score 5 11   awake, alert, oriented to person DECR MEMORY  DECRattention and concentration DECR FLUENCY; comprehension intact, naming intact fund of knowledge appropriate  CRANIAL NERVE:  2nd - no papilledema on fundoscopic exam 2nd, 3rd, 4th, 6th - pupils equal and reactive to light, visual fields full to confrontation, extraocular muscles intact, no nystagmus  5th - facial sensation symmetric 7th - facial strength symmetric 8th - hearing intact 9th - palate elevates symmetrically, uvula midline 11th - shoulder shrug symmetric 12th - tongue protrusion midline  MOTOR:  normal bulk and tone, full strength in the BUE, BLE  SENSORY:  normal and symmetric to light touch  COORDINATION:  finger-nose-finger, fine finger movements normal  REFLEXES:  deep tendon reflexes present and symmetric  GAIT/STATION:  narrow based gait    DIAGNOSTIC DATA (LABS, IMAGING, TESTING) - I reviewed patient records, labs, notes, testing and imaging myself where available.  Lab Results  Component Value Date   WBC 11.8 (H) 11/19/2014   HGB 14.2 11/19/2014   HCT 41.3 11/19/2014   MCV 89.6 11/19/2014   PLT 214 11/19/2014      Component Value Date/Time   NA 142 11/19/2014 0323   K 3.2 (L) 11/19/2014 0323   CL 107 11/19/2014 0323   CO2 27 11/19/2014 0323   GLUCOSE 168 (H) 11/19/2014 0323   BUN 14 11/19/2014 0323   CREATININE 0.81 11/19/2014 0323   CALCIUM 8.8 (L) 11/19/2014 0323   PROT 6.4 (L) 11/19/2014 0323   ALBUMIN 4.0 11/19/2014 0323   AST 21 11/19/2014 0323   ALT 16 (L) 11/19/2014 0323   ALKPHOS 81 11/19/2014 0323   BILITOT 1.0 11/19/2014 0323   GFRNONAA >60 11/19/2014 0323   GFRAA >60 11/19/2014 0323   No  results found for: CHOL, HDL, LDLCALC, LDLDIRECT, TRIG, CHOLHDL No results found for: HGBA1C No results found for: VITAMINB12 No results found for: TSH   08/24/14 MRI brain [I reviewed images myself and agree with interpretation. -VRP]   - No acute or reversible finding. Chronic small-vessel ischemic changes of the cerebral hemispheric white matter, most notable in the periatrial white matter.     ASSESSMENT AND PLAN  75 y.o. year old male here with gradual onset progressive short-term memory loss and confusion, decline in ADLs, consistent with neurodegenerative dementia.  Dx:  1. Moderate dementia with agitation, unspecified dementia type     PLAN:  MODERATE-SEVERE DEMENTIA WITH BEHAVIOR CHANGES - continue memantine, donepezil, quetiapine - safety / supervision issues reviewed - caregiver resources provided; family looking into options for addl care (home aids vs memory care / SNF) - no driving  Return for return to PCP, pending if symptoms worsen or fail to improve.    Penni Bombard, MD 70/34/0352, 48:18 PM Certified in Neurology, Neurophysiology and Neuroimaging  [facility] [address], [place], [place] [address] [phone]

## 2021-03-14 ENCOUNTER — Emergency Department (HOSPITAL_COMMUNITY)
Admission: EM | Admit: 2021-03-14 | Discharge: 2021-03-14 | Disposition: A | Discharge status: Skilled Nursing Facility | Payer: Medicare Other | Attending: Emergency Medicine | Admitting: Emergency Medicine

## 2021-03-14 ENCOUNTER — Other Ambulatory Visit: Payer: Self-pay

## 2021-03-14 DIAGNOSIS — Z87891 Personal history of nicotine dependence: Secondary | ICD-10-CM | POA: Insufficient documentation

## 2021-03-14 DIAGNOSIS — Z79899 Other long term (current) drug therapy: Secondary | ICD-10-CM | POA: Insufficient documentation

## 2021-03-14 DIAGNOSIS — K625 Hemorrhage of anus and rectum: Secondary | ICD-10-CM | POA: Insufficient documentation

## 2021-03-14 DIAGNOSIS — F0390 Unspecified dementia without behavioral disturbance: Secondary | ICD-10-CM | POA: Insufficient documentation

## 2021-03-14 DIAGNOSIS — I10 Essential (primary) hypertension: Secondary | ICD-10-CM | POA: Diagnosis not present

## 2021-03-14 DIAGNOSIS — K922 Gastrointestinal hemorrhage, unspecified: Secondary | ICD-10-CM | POA: Insufficient documentation

## 2021-03-14 DIAGNOSIS — R58 Hemorrhage, not elsewhere classified: Secondary | ICD-10-CM | POA: Diagnosis not present

## 2021-03-14 DIAGNOSIS — Z743 Need for continuous supervision: Secondary | ICD-10-CM | POA: Diagnosis not present

## 2021-03-14 DIAGNOSIS — E039 Hypothyroidism, unspecified: Secondary | ICD-10-CM | POA: Diagnosis not present

## 2021-03-14 LAB — COMPREHENSIVE METABOLIC PANEL
ALT: 21 U/L (ref 0–44)
AST: 21 U/L (ref 15–41)
Albumin: 3.9 g/dL (ref 3.5–5.0)
Alkaline Phosphatase: 96 U/L (ref 38–126)
Anion gap: 10 (ref 5–15)
BUN: 12 mg/dL (ref 8–23)
CO2: 27 mmol/L (ref 22–32)
Calcium: 8.6 mg/dL — ABNORMAL LOW (ref 8.9–10.3)
Chloride: 103 mmol/L (ref 98–111)
Creatinine, Ser: 0.78 mg/dL (ref 0.61–1.24)
GFR, Estimated: >60 mL/min (ref >60)
Glucose, Bld: 104 mg/dL — ABNORMAL HIGH (ref 70–99)
Potassium: 3.5 mmol/L (ref 3.5–5.1)
Sodium: 140 mmol/L (ref 135–145)
Total Bilirubin: 1.1 mg/dL (ref 0.3–1.2)
Total Protein: 6.4 g/dL — ABNORMAL LOW (ref 6.5–8.1)

## 2021-03-14 LAB — CBC WITH DIFFERENTIAL/PLATELET
Abs Immature Granulocytes: 0.05 10*3/uL (ref 0.00–0.07)
Basophils Absolute: 0 10*3/uL (ref 0.0–0.1)
Basophils Relative: 1 %
Eosinophils Absolute: 0.1 10*3/uL (ref 0.0–0.5)
Eosinophils Relative: 1 %
HCT: 46 % (ref 39.0–52.0)
Hemoglobin: 15.8 g/dL (ref 13.0–17.0)
Immature Granulocytes: 1 %
Lymphocytes Relative: 16 %
Lymphs Abs: 1.3 10*3/uL (ref 0.7–4.0)
MCH: 31.9 pg (ref 26.0–34.0)
MCHC: 34.3 g/dL (ref 30.0–36.0)
MCV: 92.7 fL (ref 80.0–100.0)
Monocytes Absolute: 0.8 10*3/uL (ref 0.1–1.0)
Monocytes Relative: 9 %
Neutro Abs: 5.8 10*3/uL (ref 1.7–7.7)
Neutrophils Relative %: 72 %
Platelets: 232 10*3/uL (ref 150–400)
RBC: 4.96 MIL/uL (ref 4.22–5.81)
RDW: 13.2 % (ref 11.5–15.5)
WBC: 7.9 10*3/uL (ref 4.0–10.5)
nRBC: 0 % (ref 0.0–0.2)

## 2021-03-14 LAB — TYPE AND SCREEN
ABO/RH(D): A POS
Antibody Screen: NEGATIVE

## 2021-03-14 NOTE — ED Triage Notes (Signed)
Pt BIB EMS with c/o rectal bleeding. Per EMS, facility said pt was having rectal bleeding, but could not tell them how long it has been happening or what the blood looked like. Pt is from [facility] and has dementia. Pt does not remember why he was transported to ED.

## 2021-03-14 NOTE — ED Provider Notes (Signed)
[facility] EMERGENCY DEPARTMENT Provider Note   CSN: [id number] Arrival date & time: 03/14/21  1518     History Chief Complaint  Patient presents with   Rectal Bleeding    Javier Morgan is a 75 y.o. male.   Rectal Bleeding  This patient is a 75 year old male, he has a history of dementia, level 5 caveat applies as he has no idea why he is here and has no complaints.  Report from [facility] is that he had been having some rectal bleeding, nobody at the facility could tell the paramedics when it started or what it looks like.  I have reviewed the patient's medical history and there is no signs of anticoagulants  Past Medical History:  Diagnosis Date   Dementia ([place])    Depression    GERD without esophagitis    Hypercholesteremia    Hypertension    Hypothyroidism    Osteoarthritis     Patient Active Problem List   Diagnosis Date Noted   GERD (gastroesophageal reflux disease) 12/26/2019   Encounter for screening colonoscopy 03/26/2016   Dysphagia 03/26/2016    Past Surgical History:  Procedure Laterality Date   COLONOSCOPY  2006   records not available at time of appt    COLONOSCOPY N/A 04/16/2016   rourk: diverticulosis. no furture colonoscopies for screening due to age   ESOPHAGOGASTRODUODENOSCOPY     remote past, with dilation    ESOPHAGOGASTRODUODENOSCOPY N/A 04/16/2016   Rourk: LA Grade A esophagitis   EXCISIONAL HEMORRHOIDECTOMY     MALONEY DILATION N/A 04/16/2016   Procedure: Venia Minks DILATION;  Surgeon: Daneil Dolin, MD;  [facility];  Service: Endoscopy;  Laterality: N/A;   TONSILLECTOMY         Family History  Problem Relation Age of Onset   Dementia Mother    Alzheimer's disease Mother    Emphysema Father    COPD Father    Emphysema Brother    Alzheimer's disease Maternal Aunt    Alzheimer's disease Maternal Uncle    Alzheimer's disease Paternal Aunt    Alzheimer's disease Maternal Grandmother     Heart disease Maternal Grandfather    Alzheimer's disease Other    Colon cancer Neg Hx     Social History   Tobacco Use   Smoking status: Former    Types: Cigarettes    Quit date: 03/07/1975    Years since quitting: 46.0   Smokeless tobacco: Never   Tobacco comments:    quit about 30-45 years ago   Substance Use Topics   Alcohol use: No   Drug use: No    Home Medications Prior to Admission medications   Medication Sig Start Date End Date Taking? Authorizing Provider  amLODipine (NORVASC) 10 MG tablet Take 10 mg by mouth daily.   Yes [provider]  atorvastatin (LIPITOR) 80 MG tablet Take 80 mg by mouth at bedtime.   Yes [provider]  donepezil (ARICEPT) 10 MG tablet Take 10 mg by mouth at bedtime.   Yes [provider]  escitalopram (LEXAPRO) 10 MG tablet Take 10 mg by mouth at bedtime.    Yes [provider]  levothyroxine (SYNTHROID, LEVOTHROID) 175 MCG tablet Take 175 mcg by mouth at bedtime. 03/24/16  Yes [provider]  lisinopril (PRINIVIL,ZESTRIL) 40 MG tablet Take 40 mg by mouth daily.   Yes [provider]  meclizine (ANTIVERT) 25 MG tablet Take 25 mg by mouth 3 (three) times  daily as needed. 03/03/21  Yes [provider]  memantine (NAMENDA) 10 MG tablet Take 1 tablet (10 mg total) by mouth 2 (two) times daily. 03/07/19  Yes Penumalli, Earlean Polka, MD  pantoprazole (PROTONIX) 40 MG tablet Take 40 mg by mouth See admin instructions. 40 mg every other day 03/03/21  Yes [provider]  QUEtiapine (SEROQUEL) 50 MG tablet Take 50 mg by mouth every 12 (twelve) hours as needed (agitation). 01/08/21  Yes [provider]  latanoprost (XALATAN) 0.005 % ophthalmic solution 1 drop at bedtime. Patient not taking: Reported on 03/14/2021 04/12/20   [provider]  pantoprazole (PROTONIX) 20 MG tablet TAKE 1 TABLET BY MOUTH  DAILY BEFORE BREAKFAST. Patient not taking: Reported on 03/14/2021  11/19/20   Annitta Needs, NP  vitamin B-12 (CYANOCOBALAMIN) 1000 MCG tablet Take 1,000 mcg by mouth daily. Patient not taking: Reported on 03/14/2021    [provider]  VYZULTA 0.024 % SOLN Apply to eye. Patient not taking: Reported on 03/14/2021 01/04/20   [provider]    Allergies    Patient has no known allergies.  Review of Systems   Review of Systems  Gastrointestinal:  Positive for hematochezia.  All other systems reviewed and are negative.  Physical Exam Updated Vital Signs BP (!) 143/67    Pulse 68    Temp 98.3 F (36.8 C) (Oral)    Resp 17    SpO2 98%   Physical Exam Vitals and nursing note reviewed.  Constitutional:      General: He is not in acute distress.    Appearance: He is well-developed.  HENT:     Head: Normocephalic and atraumatic.     Mouth/Throat:     Pharynx: No oropharyngeal exudate.  Eyes:     General: No scleral icterus.       Right eye: No discharge.        Left eye: No discharge.     Conjunctiva/sclera: Conjunctivae normal.     Pupils: Pupils are equal, round, and reactive to light.  Neck:     Thyroid: No thyromegaly.     Vascular: No JVD.  Cardiovascular:     Rate and Rhythm: Normal rate and regular rhythm.     Heart sounds: Normal heart sounds. No murmur heard.   No friction rub. No gallop.  Pulmonary:     Effort: Pulmonary effort is normal. No respiratory distress.     Breath sounds: Normal breath sounds. No wheezing or rales.  Abdominal:     General: Bowel sounds are normal. There is no distension.     Palpations: Abdomen is soft. There is no mass.     Tenderness: There is no abdominal tenderness.  Musculoskeletal:        General: No tenderness. Normal range of motion.     Cervical back: Normal range of motion and neck supple.     Right lower leg: No edema.     Left lower leg: No edema.  Lymphadenopathy:     Cervical: No cervical adenopathy.  Skin:    General: Skin is warm and dry.     Findings: No erythema  or rash.  Neurological:     Mental Status: He is alert.     Coordination: Coordination normal.     Comments: The patient tells me his name, he is not able to tell me where he is or why he is here  Psychiatric:        Behavior: Behavior normal.  ED Results / Procedures / Treatments   Labs (all labs ordered are listed, but only abnormal results are displayed) Labs Reviewed  COMPREHENSIVE METABOLIC PANEL - Abnormal; Notable for the following components:      Result Value   Glucose, Bld 104 (*)    Calcium 8.6 (*)    Total Protein 6.4 (*)    All other components within normal limits  CBC WITH DIFFERENTIAL/PLATELET  OCCULT BLOOD X 1 CARD TO LAB, STOOL  TYPE AND SCREEN    EKG None  Radiology No results found.  Procedures Procedures   Medications Ordered in ED Medications - No data to display  ED Course  I have reviewed the triage vital signs and the nursing notes.  Pertinent labs & imaging results that were available during my care of the patient were reviewed by me and considered in my medical decision making (see chart for details).    MDM Rules/Calculators/A&P                          This patient presents to the ED for concern of possible gastrointestinal bleeding, this involves an extensive number of treatment options, and is a complaint that carries with it a high risk of complications and morbidity.  The differential diagnosis includes GI bleed, hemorrhoidal, diverticular, upper GI bleed   Co morbidities that complicate the patient evaluation  Dementia   Additional history obtained:  Additional history obtained from electronic medical record External records from outside source obtained and reviewed including prior endoscopy that was performed in 2018, this showed that the patient had entire stomach was normal, duodenal bulb and duodenum were normal, the upper endoscopy was performed because of dysphagia for a Maloney dilation Colonoscopy was performed for  colorectal malignant neoplasm, there was diverticulosis found in the sigmoid colon otherwise no significant abnormalities.   Lab Tests:  I Ordered, and personally interpreted labs.  The pertinent results include: CBC and metabolic panel, there is no anemia whatsoever with a hemoglobin of 15.8  Due to the patient's dementia he refused cardiac monitoring, refused to get into a gown but seem to be his normal self entire time   Medicines ordered and prescription drug management:  I have reviewed the patients home medicines and have made adjustments as needed   Test Considered:  Digital rectal exam however the patient and his spouse have refused this, they both understand that he may have diverticulosis but because he has normal hemoglobin it appears stable they do not want this examined at this time they refused and want to follow-up with GI   Critical Interventions:  Check CBC to make sure he is not anemic   Reevaluation:  After the interventions noted above, I reevaluated the patient and found that they have :improved   Social Determinants of Health:  Dementia -    Dispostion:  After consideration of the diagnostic results and the patients response to treatment, I feel that the patent would benefit from discharge back to the facility, the patient and the family member both state that they do not want the exam, the wife states that as his medical decision making power of attorney she would like for him to follow-up with GI and not undergo digital rectal exam hospitalization or any other testing.       Final Clinical Impression(s) / ED Diagnoses Final diagnoses:  None    Rx / DC Orders ED Discharge Orders     None  Noemi Chapel, MD 03/14/21 [phone]

## 2021-03-14 NOTE — Discharge Instructions (Signed)
Please call the office of Dr. Gala Romney to follow-up this week.  If the bleeding becomes more heavy or more frequent you will need to return to the emergency department.  I have offered you further testing and a rectal exam to make sure that there is not significant bleeding but you have declined, this is okay as long as you come back if things get worse.

## 2021-03-14 NOTE — ED Notes (Signed)
Was able to get pt to change into a gown, howvever he was found back in his regualr clothes and walking the hall. Redirected him back to his room

## 2021-03-24 DIAGNOSIS — F02B18 Dementia in other diseases classified elsewhere, moderate, with other behavioral disturbance: Secondary | ICD-10-CM | POA: Diagnosis not present

## 2021-03-24 DIAGNOSIS — G301 Alzheimer's disease with late onset: Secondary | ICD-10-CM | POA: Diagnosis not present

## 2021-03-28 ENCOUNTER — Encounter: Payer: Self-pay | Admitting: Internal Medicine

## 2021-03-28 ENCOUNTER — Ambulatory Visit: Payer: Medicare Other | Admitting: Internal Medicine

## 2021-03-28 ENCOUNTER — Other Ambulatory Visit: Payer: Self-pay

## 2021-03-28 VITALS — BP 143/68 | HR 88 | Temp 97.1°F | Ht 75.0 in | Wt 245.2 lb

## 2021-03-28 DIAGNOSIS — K2100 Gastro-esophageal reflux disease with esophagitis, without bleeding: Secondary | ICD-10-CM

## 2021-03-28 NOTE — Progress Notes (Signed)
Primary Care Physician:  Javier Noble, MD Primary Gastroenterologist:  Dr. Gala Morgan  Pre-Procedure History & Physical: HPI:  Javier Morgan is a 76 y.o. male here for further evaluation of reported history of a single episode of painless Rectal bleeding 12/30 observed at his residence Javier Morgan).  Was taken to hospital by EMS.  He was found to be quite stable.  He declined rectal exam or extensive evaluation.  CBC was completely normal.  Hemoglobin 15.8/hematocrit 46.0 MCV 92.7 Patient denies ever having rectal bleeding before this time or since this time.  His wife who accompanies him to the visit today corroborates this observation.  He denies straining he denies constipation or diarrhea.  Typically has 1-2 bowel movements daily without any bowel regimen.  Appetite is good has not lost any weight. Colonoscopy 2018 demonstrated sigmoid diverticulosis.  No significant hemorrhoids reported.  No further examination recommended due to age. History of reflux esophagitis with Schatzki's ring dilated in 2018.  GERD has been well controlled ever since on Protonix 40 mg daily.  No dysphagia.  He denies abdominal pain. Past Medical History:  Diagnosis Date   Dementia ([place])    Depression    GERD without esophagitis    Hypercholesteremia    Hypertension    Hypothyroidism    Osteoarthritis     Past Surgical History:  Procedure Laterality Date   COLONOSCOPY  2006   records not available at time of appt    COLONOSCOPY N/A 04/16/2016   Javier Morgan: diverticulosis. no furture colonoscopies for screening due to age   ESOPHAGOGASTRODUODENOSCOPY     remote past, with dilation    ESOPHAGOGASTRODUODENOSCOPY N/A 04/16/2016   Javier Morgan: LA Grade A esophagitis   EXCISIONAL HEMORRHOIDECTOMY     MALONEY DILATION N/A 04/16/2016   Procedure: Javier Morgan DILATION;  Surgeon: Javier Dolin, MD;  Location: [facility];  Service: Endoscopy;  Laterality: N/A;   TONSILLECTOMY      Prior to Admission medications    Medication Sig Start Date End Date Taking? Authorizing Provider  amLODipine (NORVASC) 10 MG tablet Take 10 mg by mouth daily.   Yes [provider]  atorvastatin (LIPITOR) 80 MG tablet Take 80 mg by mouth at bedtime.   Yes [provider]  divalproex (DEPAKOTE SPRINKLE) 125 MG capsule Take 125 mg by mouth 2 (two) times daily.   Yes [provider]  donepezil (ARICEPT) 10 MG tablet Take 10 mg by mouth at bedtime.   Yes [provider]  escitalopram (LEXAPRO) 10 MG tablet Take 10 mg by mouth at bedtime.    Yes [provider]  levothyroxine (SYNTHROID, LEVOTHROID) 175 MCG tablet Take 175 mcg by mouth at bedtime. 03/24/16  Yes [provider]  lisinopril (PRINIVIL,ZESTRIL) 40 MG tablet Take 40 mg by mouth daily.   Yes [provider]  meclizine (ANTIVERT) 25 MG tablet Take 25 mg by mouth 3 (three) times daily as needed. 03/03/21  Yes [provider]  melatonin 3 MG TABS tablet Take 3 mg by mouth at bedtime.   Yes [provider]  memantine (NAMENDA) 10 MG tablet Take 1 tablet (10 mg total) by mouth 2 (two) times daily. 03/07/19  Yes Javier Morgan, Earlean Polka, MD  pantoprazole (PROTONIX) 40 MG tablet Take 40 mg by mouth See admin instructions. 40 mg every other day 03/03/21  Yes [provider]  QUEtiapine (SEROQUEL) 50 MG tablet Take 50 mg by mouth every 12 (twelve) hours as needed (agitation). 01/08/21  Yes  [provider]    Allergies as of 03/28/2021   (No Known Allergies)    Family History  Problem Relation Age of Onset   Dementia Mother    Alzheimer's disease Mother    Emphysema Father    COPD Father    Emphysema Brother    Alzheimer's disease Maternal Aunt    Alzheimer's disease Maternal Uncle    Alzheimer's disease Paternal Aunt    Alzheimer's disease Maternal Grandmother    Heart disease Maternal Grandfather    Alzheimer's disease Other    Colon cancer Neg Hx     Social History    Socioeconomic History   Marital status: Married    Spouse name: Javier Morgan   Number of children: 2   Years of education: Not on file   Highest education level: Some college, no degree  Occupational History    Comment: retired  Tobacco Use   Smoking status: Former    Types: Cigarettes    Quit date: 03/07/1975    Years since quitting: 46.0   Smokeless tobacco: Never   Tobacco comments:    quit about 30-45 years ago   Substance and Sexual Activity   Alcohol use: No   Drug use: No   Sexual activity: Not on file  Other Topics Concern   Not on file  Social History Narrative   01/28/21  lives with wife   Goes to adult daycare 3-5 days a week   Caffeine- 2 coffees daily   Social Determinants of Health   Financial Resource Strain: Not on file  Food Insecurity: Not on file  Transportation Needs: Not on file  Physical Activity: Not on file  Stress: Not on file  Social Connections: Not on file  Intimate Partner Violence: Not on file    Review of Systems: See HPI, otherwise negative ROS  Physical Exam: BP (!) 143/68    Pulse 88    Temp (!) 97.1 F (36.2 C) (Temporal)    Ht 6\' 3"  (1.905 m)    Wt 245 lb 3.2 oz (111.2 kg)    BMI 30.65 kg/m  General:   Alert,   pleasant and cooperative in NAD.  Responds to questions with short answers.   accompanied by his spouse. Neck:  Supple; no masses or thyromegaly. No significant cervical adenopathy. Lungs:  Clear throughout to auscultation.   No wheezes, crackles, or rhonchi. No acute distress. Heart:  Regular rate and rhythm; no murmurs, clicks, rubs,  or gallops. Abdomen: Non-distended, normal bowel sounds.  Soft and nontender without appreciable mass or hepatosplenomegaly.  Pulses:  Normal pulses noted. Extremities:  Without clubbing or edema. Rectal: No external lesions.  No stool in the rectal vault.  No mass in the rectal vault.  Mucus is Hemoccult negative.   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  Has not had any other symptoms or evidence of recurrent bleeding.  He is not anemic.  Has not had any associated abdominal pain.  GERD is well controlled on Protonix without other GI symptoms.  If he did have rectal bleeding it was low volume and self-limiting.  Benign anorectal irritation certainly a possibility.  Self-limiting bleeding secondary to diverticular or other etiology less likely.   At this time, I do not feel further evaluation is warranted.  Recommendations:  Continue Protonix 40 mg daily.  If patient has any recurrent bleeding or other bowel issues,  they should be reported.  I am glad to see he is following up with Dr. Willey Blade next month for routine visit.    Notice: This dictation was prepared with Dragon dictation along with smaller phrase technology. Any transcriptional errors that result from this process are unintentional and may not be corrected upon review.

## 2021-03-28 NOTE — Patient Instructions (Addendum)
It was good to see you again today!  Recent rectal bleeding not felt to be significant.  If it returns or he has other bowel symptoms, then further evaluation would be needed.  No further evaluation is needed at this time.  Continue Protonix 40 mg daily.   They should be reported.  They should be reported Keep your follow-up appointment with Dr. Willey Blade.  Please let me know if he develops any future bowel symptoms.

## 2021-04-08 DIAGNOSIS — M79674 Pain in right toe(s): Secondary | ICD-10-CM | POA: Diagnosis not present

## 2021-04-08 DIAGNOSIS — M79675 Pain in left toe(s): Secondary | ICD-10-CM | POA: Diagnosis not present

## 2021-04-08 DIAGNOSIS — B351 Tinea unguium: Secondary | ICD-10-CM | POA: Diagnosis not present

## 2021-04-18 DIAGNOSIS — R7303 Prediabetes: Secondary | ICD-10-CM | POA: Diagnosis not present

## 2021-04-18 DIAGNOSIS — I10 Essential (primary) hypertension: Secondary | ICD-10-CM | POA: Diagnosis not present

## 2021-04-18 DIAGNOSIS — E785 Hyperlipidemia, unspecified: Secondary | ICD-10-CM | POA: Diagnosis not present

## 2021-04-18 DIAGNOSIS — G309 Alzheimer's disease, unspecified: Secondary | ICD-10-CM | POA: Diagnosis not present

## 2021-04-18 DIAGNOSIS — E039 Hypothyroidism, unspecified: Secondary | ICD-10-CM | POA: Diagnosis not present

## 2021-04-25 DIAGNOSIS — F02811 Dementia in other diseases classified elsewhere, unspecified severity, with agitation: Secondary | ICD-10-CM | POA: Diagnosis not present

## 2021-04-25 DIAGNOSIS — Z0000 Encounter for general adult medical examination without abnormal findings: Secondary | ICD-10-CM | POA: Diagnosis not present

## 2021-04-25 DIAGNOSIS — E785 Hyperlipidemia, unspecified: Secondary | ICD-10-CM | POA: Diagnosis not present

## 2021-04-25 DIAGNOSIS — I10 Essential (primary) hypertension: Secondary | ICD-10-CM | POA: Diagnosis not present

## 2021-04-25 DIAGNOSIS — E039 Hypothyroidism, unspecified: Secondary | ICD-10-CM | POA: Diagnosis not present

## 2021-05-03 DIAGNOSIS — E870 Hyperosmolality and hypernatremia: Secondary | ICD-10-CM | POA: Diagnosis not present

## 2021-05-03 DIAGNOSIS — Z743 Need for continuous supervision: Secondary | ICD-10-CM | POA: Diagnosis not present

## 2021-05-03 DIAGNOSIS — R410 Disorientation, unspecified: Secondary | ICD-10-CM | POA: Diagnosis not present

## 2021-05-03 DIAGNOSIS — Z7401 Bed confinement status: Secondary | ICD-10-CM | POA: Diagnosis not present

## 2021-05-03 DIAGNOSIS — F03918 Unspecified dementia, unspecified severity, with other behavioral disturbance: Secondary | ICD-10-CM | POA: Diagnosis not present

## 2021-05-09 ENCOUNTER — Encounter: Payer: Self-pay | Admitting: Diagnostic Neuroimaging

## 2021-05-15 DIAGNOSIS — E785 Hyperlipidemia, unspecified: Secondary | ICD-10-CM | POA: Diagnosis not present

## 2021-05-15 DIAGNOSIS — I10 Essential (primary) hypertension: Secondary | ICD-10-CM | POA: Diagnosis not present

## 2021-05-15 DIAGNOSIS — E039 Hypothyroidism, unspecified: Secondary | ICD-10-CM | POA: Diagnosis not present

## 2021-06-02 DIAGNOSIS — Z743 Need for continuous supervision: Secondary | ICD-10-CM | POA: Diagnosis not present

## 2021-06-02 DIAGNOSIS — L723 Sebaceous cyst: Secondary | ICD-10-CM | POA: Diagnosis not present

## 2021-06-02 DIAGNOSIS — S0990XA Unspecified injury of head, initial encounter: Secondary | ICD-10-CM | POA: Diagnosis not present

## 2021-06-02 DIAGNOSIS — R5381 Other malaise: Secondary | ICD-10-CM | POA: Diagnosis not present

## 2021-06-02 DIAGNOSIS — M47812 Spondylosis without myelopathy or radiculopathy, cervical region: Secondary | ICD-10-CM | POA: Diagnosis not present

## 2021-06-02 DIAGNOSIS — S80919A Unspecified superficial injury of unspecified knee, initial encounter: Secondary | ICD-10-CM | POA: Diagnosis not present

## 2021-06-02 DIAGNOSIS — R609 Edema, unspecified: Secondary | ICD-10-CM | POA: Diagnosis not present

## 2021-06-02 DIAGNOSIS — W19XXXA Unspecified fall, initial encounter: Secondary | ICD-10-CM | POA: Diagnosis not present

## 2021-06-02 DIAGNOSIS — W0110XA Fall on same level from slipping, tripping and stumbling with subsequent striking against unspecified object, initial encounter: Secondary | ICD-10-CM | POA: Diagnosis not present

## 2021-06-02 DIAGNOSIS — R9082 White matter disease, unspecified: Secondary | ICD-10-CM | POA: Diagnosis not present

## 2021-06-02 DIAGNOSIS — S80212A Abrasion, left knee, initial encounter: Secondary | ICD-10-CM | POA: Diagnosis not present

## 2021-06-02 DIAGNOSIS — F0390 Unspecified dementia without behavioral disturbance: Secondary | ICD-10-CM | POA: Diagnosis not present

## 2021-06-02 DIAGNOSIS — Y92129 Unspecified place in nursing home as the place of occurrence of the external cause: Secondary | ICD-10-CM | POA: Diagnosis not present

## 2021-06-03 DIAGNOSIS — Z743 Need for continuous supervision: Secondary | ICD-10-CM | POA: Diagnosis not present

## 2021-06-03 DIAGNOSIS — R410 Disorientation, unspecified: Secondary | ICD-10-CM | POA: Diagnosis not present

## 2021-06-03 DIAGNOSIS — N390 Urinary tract infection, site not specified: Secondary | ICD-10-CM | POA: Diagnosis not present

## 2021-06-04 DIAGNOSIS — R5381 Other malaise: Secondary | ICD-10-CM | POA: Diagnosis not present

## 2021-06-04 DIAGNOSIS — Z7401 Bed confinement status: Secondary | ICD-10-CM | POA: Diagnosis not present

## 2021-06-04 DIAGNOSIS — R9082 White matter disease, unspecified: Secondary | ICD-10-CM | POA: Diagnosis not present

## 2021-06-04 DIAGNOSIS — R4182 Altered mental status, unspecified: Secondary | ICD-10-CM | POA: Diagnosis not present

## 2021-06-04 DIAGNOSIS — R531 Weakness: Secondary | ICD-10-CM | POA: Diagnosis not present

## 2021-06-04 DIAGNOSIS — F0390 Unspecified dementia without behavioral disturbance: Secondary | ICD-10-CM | POA: Diagnosis not present

## 2021-06-04 DIAGNOSIS — R402421 Glasgow coma scale score 9-12, in the field [EMT or ambulance]: Secondary | ICD-10-CM | POA: Diagnosis not present

## 2021-06-04 DIAGNOSIS — W19XXXA Unspecified fall, initial encounter: Secondary | ICD-10-CM | POA: Diagnosis not present

## 2021-06-04 DIAGNOSIS — M5030 Other cervical disc degeneration, unspecified cervical region: Secondary | ICD-10-CM | POA: Diagnosis not present

## 2021-06-04 DIAGNOSIS — R456 Violent behavior: Secondary | ICD-10-CM | POA: Diagnosis not present

## 2021-06-04 DIAGNOSIS — W01198A Fall on same level from slipping, tripping and stumbling with subsequent striking against other object, initial encounter: Secondary | ICD-10-CM | POA: Diagnosis not present

## 2021-06-04 DIAGNOSIS — R404 Transient alteration of awareness: Secondary | ICD-10-CM | POA: Diagnosis not present

## 2021-06-04 DIAGNOSIS — E860 Dehydration: Secondary | ICD-10-CM | POA: Diagnosis not present

## 2021-06-04 DIAGNOSIS — F03918 Unspecified dementia, unspecified severity, with other behavioral disturbance: Secondary | ICD-10-CM | POA: Diagnosis not present

## 2021-06-07 DIAGNOSIS — Y92129 Unspecified place in nursing home as the place of occurrence of the external cause: Secondary | ICD-10-CM | POA: Diagnosis not present

## 2021-06-07 DIAGNOSIS — W06XXXA Fall from bed, initial encounter: Secondary | ICD-10-CM | POA: Diagnosis not present

## 2021-06-07 DIAGNOSIS — F03918 Unspecified dementia, unspecified severity, with other behavioral disturbance: Secondary | ICD-10-CM | POA: Diagnosis not present

## 2021-06-07 DIAGNOSIS — R410 Disorientation, unspecified: Secondary | ICD-10-CM | POA: Diagnosis not present

## 2021-06-07 DIAGNOSIS — R0902 Hypoxemia: Secondary | ICD-10-CM | POA: Diagnosis not present

## 2021-06-07 DIAGNOSIS — M47892 Other spondylosis, cervical region: Secondary | ICD-10-CM | POA: Diagnosis not present

## 2021-06-07 DIAGNOSIS — R4020 Unspecified coma: Secondary | ICD-10-CM | POA: Diagnosis not present

## 2021-06-07 DIAGNOSIS — W19XXXA Unspecified fall, initial encounter: Secondary | ICD-10-CM | POA: Diagnosis not present

## 2021-06-07 DIAGNOSIS — E860 Dehydration: Secondary | ICD-10-CM | POA: Diagnosis not present

## 2021-06-07 DIAGNOSIS — K5730 Diverticulosis of large intestine without perforation or abscess without bleeding: Secondary | ICD-10-CM | POA: Diagnosis not present

## 2021-06-07 DIAGNOSIS — I959 Hypotension, unspecified: Secondary | ICD-10-CM | POA: Diagnosis not present

## 2021-06-07 DIAGNOSIS — F0390 Unspecified dementia without behavioral disturbance: Secondary | ICD-10-CM | POA: Diagnosis not present

## 2021-06-07 DIAGNOSIS — R404 Transient alteration of awareness: Secondary | ICD-10-CM | POA: Diagnosis not present

## 2021-06-07 DIAGNOSIS — Z7401 Bed confinement status: Secondary | ICD-10-CM | POA: Diagnosis not present

## 2021-06-07 DIAGNOSIS — S0990XA Unspecified injury of head, initial encounter: Secondary | ICD-10-CM | POA: Diagnosis not present

## 2021-06-07 DIAGNOSIS — S3993XA Unspecified injury of pelvis, initial encounter: Secondary | ICD-10-CM | POA: Diagnosis not present

## 2021-06-25 DIAGNOSIS — E119 Type 2 diabetes mellitus without complications: Secondary | ICD-10-CM | POA: Diagnosis not present

## 2021-06-25 DIAGNOSIS — D649 Anemia, unspecified: Secondary | ICD-10-CM | POA: Diagnosis not present

## 2021-06-27 DIAGNOSIS — R944 Abnormal results of kidney function studies: Secondary | ICD-10-CM | POA: Diagnosis not present

## 2021-07-14 DEATH — deceased

## 2021-12-01 ENCOUNTER — Encounter: Payer: Self-pay | Admitting: *Deleted
